# Patient Record
Sex: Male | Born: 1950 | Race: White | Hispanic: No | Marital: Single | Smoking: Never smoker
Health system: Southern US, Community
[De-identification: ages and names within clinical notes are randomized; demographics above are authoritative.]

## PROBLEM LIST (undated history)

## (undated) DIAGNOSIS — C8510 Unspecified B-cell lymphoma, unspecified site: Secondary | ICD-10-CM

## (undated) DIAGNOSIS — Z9221 Personal history of antineoplastic chemotherapy: Secondary | ICD-10-CM

## (undated) DIAGNOSIS — R7303 Prediabetes: Secondary | ICD-10-CM

## (undated) DIAGNOSIS — D696 Thrombocytopenia, unspecified: Secondary | ICD-10-CM

## (undated) DIAGNOSIS — D72819 Decreased white blood cell count, unspecified: Secondary | ICD-10-CM

## (undated) DIAGNOSIS — Z9289 Personal history of other medical treatment: Secondary | ICD-10-CM

## (undated) DIAGNOSIS — E782 Mixed hyperlipidemia: Secondary | ICD-10-CM

## (undated) DIAGNOSIS — B029 Zoster without complications: Secondary | ICD-10-CM

## (undated) DIAGNOSIS — C4490 Unspecified malignant neoplasm of skin, unspecified: Secondary | ICD-10-CM

## (undated) DIAGNOSIS — Z87442 Personal history of urinary calculi: Secondary | ICD-10-CM

## (undated) HISTORY — PX: DUPUYTREN / PALMAR FASCIOTOMY: SUR601

## (undated) HISTORY — DX: Zoster without complications: B02.9

## (undated) HISTORY — DX: Unspecified malignant neoplasm of skin, unspecified: C44.90

## (undated) HISTORY — DX: Unspecified B-cell lymphoma, unspecified site: C85.10

## (undated) HISTORY — DX: Decreased white blood cell count, unspecified: D72.819

## (undated) HISTORY — DX: Thrombocytopenia, unspecified: D69.6

## (undated) HISTORY — DX: Mixed hyperlipidemia: E78.2

## (undated) HISTORY — PX: OTHER SURGICAL HISTORY: SHX169

---

## 1997-08-14 ENCOUNTER — Ambulatory Visit (HOSPITAL_COMMUNITY): Admission: RE | Admit: 1997-08-14 | Discharge: 1997-08-14 | Payer: Self-pay | Admitting: *Deleted

## 1998-12-24 ENCOUNTER — Encounter: Admission: RE | Admit: 1998-12-24 | Discharge: 1998-12-24 | Payer: Self-pay | Admitting: *Deleted

## 1998-12-24 ENCOUNTER — Encounter: Payer: Self-pay | Admitting: *Deleted

## 1999-01-14 ENCOUNTER — Encounter (INDEPENDENT_AMBULATORY_CARE_PROVIDER_SITE_OTHER): Payer: Self-pay | Admitting: *Deleted

## 1999-01-14 ENCOUNTER — Ambulatory Visit (HOSPITAL_BASED_OUTPATIENT_CLINIC_OR_DEPARTMENT_OTHER): Admission: RE | Admit: 1999-01-14 | Discharge: 1999-01-14 | Payer: Self-pay

## 1999-01-25 ENCOUNTER — Encounter: Payer: Self-pay | Admitting: *Deleted

## 1999-01-25 ENCOUNTER — Encounter: Admission: RE | Admit: 1999-01-25 | Discharge: 1999-01-25 | Payer: Self-pay | Admitting: *Deleted

## 1999-01-28 ENCOUNTER — Ambulatory Visit (HOSPITAL_COMMUNITY): Admission: RE | Admit: 1999-01-28 | Discharge: 1999-01-28 | Payer: Self-pay | Admitting: Oncology

## 1999-02-01 ENCOUNTER — Ambulatory Visit (HOSPITAL_COMMUNITY): Admission: RE | Admit: 1999-02-01 | Discharge: 1999-02-01 | Payer: Self-pay | Admitting: Oncology

## 1999-02-01 ENCOUNTER — Encounter: Payer: Self-pay | Admitting: Oncology

## 1999-04-13 ENCOUNTER — Encounter: Admission: RE | Admit: 1999-04-13 | Discharge: 1999-04-13 | Payer: Self-pay | Admitting: Oncology

## 1999-04-13 ENCOUNTER — Encounter: Payer: Self-pay | Admitting: Oncology

## 1999-04-23 ENCOUNTER — Ambulatory Visit (HOSPITAL_COMMUNITY): Admission: RE | Admit: 1999-04-23 | Discharge: 1999-04-23 | Payer: Self-pay | Admitting: Oncology

## 1999-06-13 ENCOUNTER — Ambulatory Visit (HOSPITAL_COMMUNITY): Admission: RE | Admit: 1999-06-13 | Discharge: 1999-06-13 | Payer: Self-pay | Admitting: Oncology

## 1999-06-13 ENCOUNTER — Encounter: Payer: Self-pay | Admitting: Oncology

## 1999-06-14 ENCOUNTER — Encounter: Admission: RE | Admit: 1999-06-14 | Discharge: 1999-06-14 | Payer: Self-pay | Admitting: Oncology

## 1999-06-14 ENCOUNTER — Encounter: Payer: Self-pay | Admitting: Oncology

## 1999-10-05 ENCOUNTER — Encounter: Payer: Self-pay | Admitting: Oncology

## 1999-10-05 ENCOUNTER — Encounter: Admission: RE | Admit: 1999-10-05 | Discharge: 1999-10-05 | Payer: Self-pay | Admitting: Oncology

## 2000-02-02 ENCOUNTER — Encounter: Admission: RE | Admit: 2000-02-02 | Discharge: 2000-02-02 | Payer: Self-pay | Admitting: Oncology

## 2000-02-02 ENCOUNTER — Encounter: Payer: Self-pay | Admitting: Oncology

## 2000-08-09 ENCOUNTER — Encounter: Payer: Self-pay | Admitting: Oncology

## 2000-08-09 ENCOUNTER — Encounter: Admission: RE | Admit: 2000-08-09 | Discharge: 2000-08-09 | Payer: Self-pay | Admitting: Oncology

## 2000-12-05 ENCOUNTER — Encounter: Admission: RE | Admit: 2000-12-05 | Discharge: 2000-12-05 | Payer: Self-pay | Admitting: Oncology

## 2000-12-05 ENCOUNTER — Encounter: Payer: Self-pay | Admitting: Oncology

## 2001-06-20 ENCOUNTER — Encounter: Admission: RE | Admit: 2001-06-20 | Discharge: 2001-06-20 | Payer: Self-pay | Admitting: Oncology

## 2001-06-20 ENCOUNTER — Encounter: Payer: Self-pay | Admitting: Oncology

## 2001-12-17 ENCOUNTER — Encounter: Payer: Self-pay | Admitting: Oncology

## 2001-12-17 ENCOUNTER — Encounter: Admission: RE | Admit: 2001-12-17 | Discharge: 2001-12-17 | Payer: Self-pay | Admitting: Oncology

## 2002-06-17 ENCOUNTER — Encounter: Payer: Self-pay | Admitting: Oncology

## 2002-06-17 ENCOUNTER — Encounter: Admission: RE | Admit: 2002-06-17 | Discharge: 2002-06-17 | Payer: Self-pay | Admitting: Oncology

## 2003-01-15 ENCOUNTER — Encounter: Admission: RE | Admit: 2003-01-15 | Discharge: 2003-01-15 | Payer: Self-pay | Admitting: Oncology

## 2003-08-24 ENCOUNTER — Encounter: Admission: RE | Admit: 2003-08-24 | Discharge: 2003-08-24 | Payer: Self-pay | Admitting: Oncology

## 2004-02-22 ENCOUNTER — Ambulatory Visit: Payer: Self-pay | Admitting: Oncology

## 2004-02-24 ENCOUNTER — Ambulatory Visit (HOSPITAL_COMMUNITY): Admission: RE | Admit: 2004-02-24 | Discharge: 2004-02-24 | Payer: Self-pay | Admitting: Oncology

## 2005-02-21 ENCOUNTER — Ambulatory Visit: Payer: Self-pay | Admitting: Oncology

## 2005-02-23 ENCOUNTER — Ambulatory Visit (HOSPITAL_COMMUNITY): Admission: RE | Admit: 2005-02-23 | Discharge: 2005-02-23 | Payer: Self-pay | Admitting: Oncology

## 2006-02-14 ENCOUNTER — Ambulatory Visit: Payer: Self-pay | Admitting: Oncology

## 2006-02-20 ENCOUNTER — Ambulatory Visit (HOSPITAL_COMMUNITY): Admission: RE | Admit: 2006-02-20 | Discharge: 2006-02-20 | Payer: Self-pay | Admitting: Oncology

## 2006-02-20 LAB — CBC WITH DIFFERENTIAL/PLATELET
EOS%: 3.5 % (ref 0.0–7.0)
Eosinophils Absolute: 0.2 10*3/uL (ref 0.0–0.5)
HGB: 15.1 g/dL (ref 13.0–17.1)
MCV: 94.4 fL (ref 81.6–98.0)
MONO%: 8.9 % (ref 0.0–13.0)
NEUT#: 3.3 10*3/uL (ref 1.5–6.5)
RBC: 4.45 10*6/uL (ref 4.20–5.71)
RDW: 13.1 % (ref 11.2–14.6)
lymph#: 1.1 10*3/uL (ref 0.9–3.3)

## 2006-02-20 LAB — MORPHOLOGY: PLT EST: ADEQUATE

## 2006-02-20 LAB — COMPREHENSIVE METABOLIC PANEL
AST: 19 U/L (ref 0–37)
Alkaline Phosphatase: 86 U/L (ref 39–117)
BUN: 12 mg/dL (ref 6–23)
Glucose, Bld: 77 mg/dL (ref 70–99)
Sodium: 140 mEq/L (ref 135–145)
Total Bilirubin: 0.7 mg/dL (ref 0.3–1.2)

## 2007-02-21 ENCOUNTER — Ambulatory Visit: Payer: Self-pay | Admitting: Oncology

## 2007-02-25 LAB — COMPREHENSIVE METABOLIC PANEL
ALT: 32 U/L (ref 0–53)
CO2: 29 mEq/L (ref 19–32)
Calcium: 9.3 mg/dL (ref 8.4–10.5)
Chloride: 100 mEq/L (ref 96–112)
Creatinine, Ser: 0.91 mg/dL (ref 0.40–1.50)
Glucose, Bld: 153 mg/dL — ABNORMAL HIGH (ref 70–99)
Total Bilirubin: 0.9 mg/dL (ref 0.3–1.2)
Total Protein: 6.8 g/dL (ref 6.0–8.3)

## 2007-02-25 LAB — CBC WITH DIFFERENTIAL/PLATELET
BASO%: 0.2 % (ref 0.0–2.0)
Basophils Absolute: 0 10*3/uL (ref 0.0–0.1)
EOS%: 1.9 % (ref 0.0–7.0)
HCT: 41.1 % (ref 38.7–49.9)
HGB: 14.3 g/dL (ref 13.0–17.1)
LYMPH%: 15.9 % (ref 14.0–48.0)
MCH: 32.9 pg (ref 28.0–33.4)
MCHC: 34.8 g/dL (ref 32.0–35.9)
MCV: 94.4 fL (ref 81.6–98.0)
NEUT%: 72.7 % (ref 40.0–75.0)
Platelets: 243 10*3/uL (ref 145–400)

## 2007-02-25 LAB — SEDIMENTATION RATE: Sed Rate: 18 mm/hr — ABNORMAL HIGH (ref 0–16)

## 2007-02-25 LAB — LACTATE DEHYDROGENASE: LDH: 138 U/L (ref 94–250)

## 2007-02-26 ENCOUNTER — Ambulatory Visit (HOSPITAL_COMMUNITY): Admission: RE | Admit: 2007-02-26 | Discharge: 2007-02-26 | Payer: Self-pay | Admitting: Oncology

## 2008-02-21 ENCOUNTER — Ambulatory Visit: Payer: Self-pay | Admitting: Oncology

## 2008-02-25 LAB — COMPREHENSIVE METABOLIC PANEL
ALT: 34 U/L (ref 0–53)
Albumin: 3.9 g/dL (ref 3.5–5.2)
CO2: 29 mEq/L (ref 19–32)
Calcium: 9.1 mg/dL (ref 8.4–10.5)
Chloride: 102 mEq/L (ref 96–112)
Potassium: 3.8 mEq/L (ref 3.5–5.3)
Sodium: 137 mEq/L (ref 135–145)
Total Bilirubin: 1.2 mg/dL (ref 0.3–1.2)
Total Protein: 6.2 g/dL (ref 6.0–8.3)

## 2008-02-25 LAB — CBC WITH DIFFERENTIAL/PLATELET
Eosinophils Absolute: 0.2 10*3/uL (ref 0.0–0.5)
HCT: 43.4 % (ref 38.7–49.9)
LYMPH%: 20.7 % (ref 14.0–48.0)
MCV: 96.2 fL (ref 81.6–98.0)
MONO#: 0.4 10*3/uL (ref 0.1–0.9)
MONO%: 7.9 % (ref 0.0–13.0)
NEUT#: 3.2 10*3/uL (ref 1.5–6.5)
NEUT%: 67.7 % (ref 40.0–75.0)
Platelets: 174 10*3/uL (ref 145–400)
WBC: 4.8 10*3/uL (ref 4.0–10.0)

## 2008-02-25 LAB — MORPHOLOGY: PLT EST: ADEQUATE

## 2008-02-26 LAB — LACTATE DEHYDROGENASE: LDH: 166 U/L (ref 94–250)

## 2009-02-22 ENCOUNTER — Ambulatory Visit: Payer: Self-pay | Admitting: Oncology

## 2009-02-24 LAB — CBC WITH DIFFERENTIAL/PLATELET
BASO%: 0.4 % (ref 0.0–2.0)
Basophils Absolute: 0 10*3/uL (ref 0.0–0.1)
EOS%: 4.1 % (ref 0.0–7.0)
Eosinophils Absolute: 0.2 10*3/uL (ref 0.0–0.5)
HCT: 42.5 % (ref 38.4–49.9)
HGB: 14.7 g/dL (ref 13.0–17.1)
LYMPH%: 20.8 % (ref 14.0–49.0)
MCH: 34 pg — ABNORMAL HIGH (ref 27.2–33.4)
MCHC: 34.6 g/dL (ref 32.0–36.0)
MCV: 98.1 fL — ABNORMAL HIGH (ref 79.3–98.0)
MONO#: 0.4 10*3/uL (ref 0.1–0.9)
MONO%: 9.2 % (ref 0.0–14.0)
NEUT#: 2.6 10*3/uL (ref 1.5–6.5)
NEUT%: 65.5 % (ref 39.0–75.0)
Platelets: 170 10*3/uL (ref 140–400)
RBC: 4.34 10*6/uL (ref 4.20–5.82)
RDW: 13.3 % (ref 11.0–14.6)
WBC: 4 10*3/uL (ref 4.0–10.3)
lymph#: 0.8 10*3/uL — ABNORMAL LOW (ref 0.9–3.3)

## 2009-02-24 LAB — COMPREHENSIVE METABOLIC PANEL
ALT: 21 U/L (ref 0–53)
AST: 21 U/L (ref 0–37)
Albumin: 4.5 g/dL (ref 3.5–5.2)
Alkaline Phosphatase: 78 U/L (ref 39–117)
BUN: 16 mg/dL (ref 6–23)
CO2: 25 mEq/L (ref 19–32)
Calcium: 9.1 mg/dL (ref 8.4–10.5)
Chloride: 103 mEq/L (ref 96–112)
Creatinine, Ser: 1.03 mg/dL (ref 0.40–1.50)
Glucose, Bld: 90 mg/dL (ref 70–99)
Potassium: 4.6 mEq/L (ref 3.5–5.3)
Sodium: 138 mEq/L (ref 135–145)
Total Bilirubin: 1 mg/dL (ref 0.3–1.2)
Total Protein: 6.5 g/dL (ref 6.0–8.3)

## 2009-02-24 LAB — MORPHOLOGY
PLT EST: ADEQUATE
RBC Comments: NORMAL

## 2009-02-24 LAB — LACTATE DEHYDROGENASE: LDH: 139 U/L (ref 94–250)

## 2009-03-02 ENCOUNTER — Ambulatory Visit (HOSPITAL_COMMUNITY): Admission: RE | Admit: 2009-03-02 | Discharge: 2009-03-02 | Payer: Self-pay | Admitting: Oncology

## 2009-04-01 ENCOUNTER — Ambulatory Visit: Payer: Self-pay | Admitting: Oncology

## 2010-01-29 ENCOUNTER — Encounter: Payer: Self-pay | Admitting: Oncology

## 2010-01-30 ENCOUNTER — Encounter: Payer: Self-pay | Admitting: Oncology

## 2010-03-28 ENCOUNTER — Other Ambulatory Visit: Payer: Self-pay | Admitting: Oncology

## 2010-03-28 ENCOUNTER — Encounter (HOSPITAL_BASED_OUTPATIENT_CLINIC_OR_DEPARTMENT_OTHER): Payer: BC Managed Care – PPO | Admitting: Oncology

## 2010-03-28 DIAGNOSIS — C8295 Follicular lymphoma, unspecified, lymph nodes of inguinal region and lower limb: Secondary | ICD-10-CM

## 2010-03-28 LAB — CBC WITH DIFFERENTIAL/PLATELET
Basophils Absolute: 0 10*3/uL (ref 0.0–0.1)
Eosinophils Absolute: 0.1 10*3/uL (ref 0.0–0.5)
HGB: 14.1 g/dL (ref 13.0–17.1)
MCV: 96.3 fL (ref 79.3–98.0)
MONO#: 0.4 10*3/uL (ref 0.1–0.9)
MONO%: 10.3 % (ref 0.0–14.0)
NEUT#: 2.6 10*3/uL (ref 1.5–6.5)
Platelets: 172 10*3/uL (ref 140–400)
RBC: 4.18 10*6/uL — ABNORMAL LOW (ref 4.20–5.82)
RDW: 14.3 % (ref 11.0–14.6)
WBC: 4.2 10*3/uL (ref 4.0–10.3)

## 2010-03-28 LAB — MORPHOLOGY: RBC Comments: NORMAL

## 2010-03-28 LAB — COMPREHENSIVE METABOLIC PANEL
ALT: 28 U/L (ref 0–53)
AST: 28 U/L (ref 0–37)
Albumin: 4.1 g/dL (ref 3.5–5.2)
Alkaline Phosphatase: 79 U/L (ref 39–117)
Calcium: 9.2 mg/dL (ref 8.4–10.5)
Chloride: 107 mEq/L (ref 96–112)
Potassium: 4.2 mEq/L (ref 3.5–5.3)
Sodium: 140 mEq/L (ref 135–145)
Total Protein: 6.5 g/dL (ref 6.0–8.3)

## 2010-04-11 ENCOUNTER — Ambulatory Visit (HOSPITAL_COMMUNITY)
Admission: RE | Admit: 2010-04-11 | Discharge: 2010-04-11 | Disposition: A | Discharge status: Home / Self Care | Payer: BC Managed Care – PPO | Source: Ambulatory Visit | Attending: Oncology | Admitting: Oncology

## 2010-04-11 ENCOUNTER — Encounter: Payer: BC Managed Care – PPO | Admitting: Oncology

## 2010-04-11 ENCOUNTER — Other Ambulatory Visit: Payer: Self-pay | Admitting: Oncology

## 2010-04-11 DIAGNOSIS — C8589 Other specified types of non-Hodgkin lymphoma, extranodal and solid organ sites: Secondary | ICD-10-CM | POA: Insufficient documentation

## 2010-04-11 DIAGNOSIS — C8590 Non-Hodgkin lymphoma, unspecified, unspecified site: Secondary | ICD-10-CM

## 2011-04-04 ENCOUNTER — Telehealth: Payer: Self-pay | Admitting: Oncology

## 2011-04-04 NOTE — Telephone Encounter (Signed)
lmonvm for pt re appts for 4/5 and 4/12.

## 2011-04-14 ENCOUNTER — Other Ambulatory Visit: Payer: BC Managed Care – PPO | Admitting: Lab

## 2011-04-18 ENCOUNTER — Telehealth: Payer: Self-pay | Admitting: Oncology

## 2011-04-18 NOTE — Telephone Encounter (Signed)
Return call from [facility] @ Dr. Gabriel Rung office. Per Wille Celeste pt will be out of town and cannot come 4/12. appt r/s to 6/10 @ 11:30 am. Per Wille Celeste June ok and was given appt for 6*10 @ 11:30 am.

## 2011-04-21 ENCOUNTER — Ambulatory Visit: Payer: BC Managed Care – PPO | Admitting: Oncology

## 2011-06-19 ENCOUNTER — Ambulatory Visit (HOSPITAL_BASED_OUTPATIENT_CLINIC_OR_DEPARTMENT_OTHER): Payer: BC Managed Care – PPO | Admitting: Oncology

## 2011-06-19 ENCOUNTER — Ambulatory Visit: Payer: BC Managed Care – PPO

## 2011-06-19 ENCOUNTER — Ambulatory Visit (HOSPITAL_BASED_OUTPATIENT_CLINIC_OR_DEPARTMENT_OTHER): Payer: BC Managed Care – PPO | Admitting: Lab

## 2011-06-19 ENCOUNTER — Telehealth: Payer: Self-pay | Admitting: Oncology

## 2011-06-19 ENCOUNTER — Encounter: Payer: Self-pay | Admitting: Oncology

## 2011-06-19 VITALS — BP 144/87 | HR 64 | Temp 97.0°F | Ht 73.0 in | Wt 235.8 lb

## 2011-06-19 DIAGNOSIS — E782 Mixed hyperlipidemia: Secondary | ICD-10-CM | POA: Insufficient documentation

## 2011-06-19 DIAGNOSIS — C8589 Other specified types of non-Hodgkin lymphoma, extranodal and solid organ sites: Secondary | ICD-10-CM

## 2011-06-19 DIAGNOSIS — C8590 Non-Hodgkin lymphoma, unspecified, unspecified site: Secondary | ICD-10-CM

## 2011-06-19 DIAGNOSIS — B029 Zoster without complications: Secondary | ICD-10-CM | POA: Insufficient documentation

## 2011-06-19 DIAGNOSIS — C8510 Unspecified B-cell lymphoma, unspecified site: Secondary | ICD-10-CM

## 2011-06-19 DIAGNOSIS — Z8579 Personal history of other malignant neoplasms of lymphoid, hematopoietic and related tissues: Secondary | ICD-10-CM | POA: Insufficient documentation

## 2011-06-19 LAB — CBC WITH DIFFERENTIAL/PLATELET
Basophils Absolute: 0 10*3/uL (ref 0.0–0.1)
Eosinophils Absolute: 0.1 10*3/uL (ref 0.0–0.5)
HCT: 46.4 % (ref 38.4–49.9)
HGB: 15.8 g/dL (ref 13.0–17.1)
MCV: 97.3 fL (ref 79.3–98.0)
MONO%: 7.7 % (ref 0.0–14.0)
NEUT#: 3.9 10*3/uL (ref 1.5–6.5)
NEUT%: 71.2 % (ref 39.0–75.0)
RDW: 13.6 % (ref 11.0–14.6)

## 2011-06-19 NOTE — Telephone Encounter (Signed)
gv pt appt for [id number]

## 2011-06-19 NOTE — Progress Notes (Signed)
Hematology and Oncology Follow Up Visit  Frank Walsh [id number] 03-Aug-1950 61 y.o. 06/19/2011 5:57 PM   Principle Diagnosis: Encounter Diagnosis  Name Primary?  . 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 A biopsy showed a follicular grade 2 B-cell non-Hodgkin's lymphoma. He was treated with CHOP chemotherapy for 6 cycles and achieved a complete response. He received 4 doses of Rituxan anti-B-cell antibody consolidation after the chemotherapy program. He achieved a durable response with no signs of relapse.  He is doing well at this time. He has no constitutional symptoms. 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  Medications: reviewed  Allergies: No Known Allergies  Review of Systems: Constitutional:   See above Respiratory: No cough or dyspnea Cardiovascular: No chest pain or palpitations  Gastrointestinal: No change in bowel habit Genito-Urinary: Not questioned Musculoskeletal: No muscle or bone pain Neurologic: No headache or change in vision Skin: No rash Remaining ROS negative.  Physical Exam: Blood pressure 144/87, pulse 64, temperature 97 F (36.1 C), temperature source Oral, height 6\' 1"  (1.854 m), weight 235 lb 12.8 oz (106.958 kg). Wt Readings from Last 3 Encounters:  06/19/11 235 lb 12.8 oz (106.958 kg)     General appearance: Well-nourished Caucasian man HENNT: Pharynx no erythema or exudate Lymph nodes: No cervical, supraclavicular, axillary, or inguinal adenopathy Breasts: Lungs: Clear to auscultation resonant to percussion Heart: Regular rhythm no murmur Abdomen: Soft nontender no mass no organomegaly Extremities: No edema no calf tenderness Vascular: No cyanosis Neurologic: Motor strength 5 over  5, reflexes 2+ symmetric Skin: No rash or ecchymosis  Lab Results: Lab Results  Component Value Date   WBC 5.4 06/19/2011   HGB 15.8 06/19/2011   HCT 46.4 06/19/2011   MCV 97.3 06/19/2011   PLT 174 06/19/2011     Chemistry      Component Value Date/Time   NA 140 03/28/2010 1620   K 4.2 03/28/2010 1620   CL 107 03/28/2010 1620   CO2 26 03/28/2010 1620   BUN 12 03/28/2010 1620   CREATININE 0.92 03/28/2010 1620      Component Value Date/Time   CALCIUM 9.2 03/28/2010 1620   ALKPHOS 79 03/28/2010 1620   AST 28 03/28/2010 1620   ALT 28 03/28/2010 1620   BILITOT 1.0 03/28/2010 1620      Impression and Plan: Stage IA, follicular grade 2, B-cell non-Hodgkin's lymphoma treated as outlined above. He remains in a clinical remission at this time now out over 12 years from diagnosis in January 2001. Plan: Continue annual followup  CC:. Dr. Marden Noble   Levert Feinstein, MD 6/10/20135:57 PM

## 2011-06-20 ENCOUNTER — Telehealth: Payer: Self-pay | Admitting: *Deleted

## 2011-06-20 LAB — COMPREHENSIVE METABOLIC PANEL
ALT: 26 U/L (ref 0–53)
CO2: 28 mEq/L (ref 19–32)
Calcium: 9.6 mg/dL (ref 8.4–10.5)
Chloride: 103 mEq/L (ref 96–112)
Creatinine, Ser: 0.9 mg/dL (ref 0.50–1.35)
Glucose, Bld: 107 mg/dL — ABNORMAL HIGH (ref 70–99)
Sodium: 139 mEq/L (ref 135–145)
Total Protein: 6.6 g/dL (ref 6.0–8.3)

## 2011-06-20 LAB — SEDIMENTATION RATE: Sed Rate: 4 mm/hr (ref 0–16)

## 2011-06-20 LAB — LACTATE DEHYDROGENASE: LDH: 129 U/L (ref 94–250)

## 2011-06-20 NOTE — Telephone Encounter (Signed)
Message copied by Sabino Snipes on Tue Jun 20, 2011  2:50 PM ------      Message from: Levert Feinstein      Created: Mon Jun 19, 2011  7:06 PM       Call Dr Marcha Solders - lab good - route cc to Dr Elvera Lennox Johnella Moloney

## 2011-06-20 NOTE — Telephone Encounter (Signed)
Results of labs given to pt per Dr. Cyndie Chime.  The pt would like a copy of these labs & asked that they be faxed to [phone].  CC to Dr. Johnella Moloney electronically.

## 2012-03-07 ENCOUNTER — Telehealth: Payer: Self-pay | Admitting: Oncology

## 2012-03-07 NOTE — Telephone Encounter (Signed)
lm gv pt appts d/t. ask pt to call back to confirm..made pt awae of letter/cal to be mailed...td °

## 2012-03-15 ENCOUNTER — Encounter (INDEPENDENT_AMBULATORY_CARE_PROVIDER_SITE_OTHER): Payer: BC Managed Care – PPO | Admitting: Ophthalmology

## 2012-03-15 DIAGNOSIS — H43819 Vitreous degeneration, unspecified eye: Secondary | ICD-10-CM

## 2012-03-20 ENCOUNTER — Ambulatory Visit (INDEPENDENT_AMBULATORY_CARE_PROVIDER_SITE_OTHER): Payer: BC Managed Care – PPO | Admitting: Ophthalmology

## 2012-03-28 ENCOUNTER — Ambulatory Visit (INDEPENDENT_AMBULATORY_CARE_PROVIDER_SITE_OTHER): Payer: BC Managed Care – PPO | Admitting: Ophthalmology

## 2012-06-11 ENCOUNTER — Other Ambulatory Visit: Payer: BC Managed Care – PPO | Admitting: Lab

## 2012-06-11 ENCOUNTER — Ambulatory Visit: Payer: BC Managed Care – PPO | Admitting: Oncology

## 2012-07-01 ENCOUNTER — Other Ambulatory Visit (HOSPITAL_BASED_OUTPATIENT_CLINIC_OR_DEPARTMENT_OTHER): Payer: BC Managed Care – PPO | Admitting: Lab

## 2012-07-01 ENCOUNTER — Telehealth: Payer: Self-pay | Admitting: Oncology

## 2012-07-01 ENCOUNTER — Ambulatory Visit (HOSPITAL_BASED_OUTPATIENT_CLINIC_OR_DEPARTMENT_OTHER): Payer: BC Managed Care – PPO | Admitting: Oncology

## 2012-07-01 ENCOUNTER — Encounter: Payer: Self-pay | Admitting: Oncology

## 2012-07-01 VITALS — BP 137/80 | HR 56 | Temp 97.9°F | Resp 18 | Ht 73.0 in | Wt 238.2 lb

## 2012-07-01 DIAGNOSIS — D72819 Decreased white blood cell count, unspecified: Secondary | ICD-10-CM | POA: Insufficient documentation

## 2012-07-01 DIAGNOSIS — C8589 Other specified types of non-Hodgkin lymphoma, extranodal and solid organ sites: Secondary | ICD-10-CM

## 2012-07-01 DIAGNOSIS — C8510 Unspecified B-cell lymphoma, unspecified site: Secondary | ICD-10-CM

## 2012-07-01 DIAGNOSIS — C859A Non-Hodgkin lymphoma, unspecified, in remission: Secondary | ICD-10-CM

## 2012-07-01 DIAGNOSIS — C8590 Non-Hodgkin lymphoma, unspecified, unspecified site: Secondary | ICD-10-CM

## 2012-07-01 DIAGNOSIS — D696 Thrombocytopenia, unspecified: Secondary | ICD-10-CM

## 2012-07-01 LAB — COMPREHENSIVE METABOLIC PANEL (CC13)
ALT: 30 U/L (ref 0–55)
BUN: 18.5 mg/dL (ref 7.0–26.0)
CO2: 24 mEq/L (ref 22–29)
Calcium: 9.1 mg/dL (ref 8.4–10.4)
Chloride: 107 mEq/L (ref 98–107)
Creatinine: 0.9 mg/dL (ref 0.7–1.3)

## 2012-07-01 LAB — CBC WITH DIFFERENTIAL/PLATELET
BASO%: 0.3 % (ref 0.0–2.0)
EOS%: 5.2 % (ref 0.0–7.0)
MCH: 32 pg (ref 27.2–33.4)
MCHC: 32.7 g/dL (ref 32.0–36.0)
RDW: 13.8 % (ref 11.0–14.6)
lymph#: 0.9 10*3/uL (ref 0.9–3.3)

## 2012-07-01 LAB — LACTATE DEHYDROGENASE (CC13): LDH: 167 U/L (ref 125–245)

## 2012-07-01 LAB — SEDIMENTATION RATE: Sed Rate: 1 mm/hr (ref 0–16)

## 2012-07-01 NOTE — Telephone Encounter (Signed)
Gave appt for lab and MD visit on 2015 visit to secretary

## 2012-07-01 NOTE — Telephone Encounter (Signed)
lvm for pt regarding to June 2015 appt....mailed pt avs and letter

## 2012-07-01 NOTE — Progress Notes (Signed)
Hematology and Oncology Follow Up Visit  Frank Walsh [id number] [birth year] 62 y.o. 07/01/2012 1:51 PM   Principle Diagnosis: Encounter Diagnoses  Name Primary?  . Leukopenia   . Thrombocytopenia, unspecified   . 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 A biopsy showed a follicular grade 2 B-cell non-Hodgkin's lymphoma. He was treated with CHOP chemotherapy for 6 cycles and achieved a complete response. He received 4 doses of Rituxan anti-B-cell antibody consolidation after the chemotherapy program. He achieved a durable response with no signs of relapse to date.  He is doing he did develop sudden onset of change in vision in his left eye and was found to have a retinal detachment. He was referred to Dr. [name] and had laser surgery. He had some "weak spots" in the right eye and was given laser treatments to this eye as well. On the other changes increasing hypertrophy of the tendons in his hands with a trigger finger on the left small finger and additional tendon hypertrophy on his right hand as well.  He reports no constitutional symptoms. He has had no intercurrent infections. He has not started any new medications. 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   Medications: reviewed  Allergies: No Known Allergies  Review of Systems: Constitutional:   See above Respiratory: No cough or  dyspnea Cardiovascular:  No chest pain or palpitations Gastrointestinal: No change in bowel habit Genito-Urinary: Not questioned Musculoskeletal: See above Neurologic: See above Skin: No rash Remaining ROS negative.  Physical Exam: Blood pressure 137/80, pulse 56, temperature 97.9 F (36.6 C), temperature source Oral, resp. rate 18, height 6\' 1"  (1.854 m), weight 238 lb 3.2 oz (108.047 kg), SpO2 96.00%. Wt Readings from Last 3 Encounters:  07/01/12 238 lb 3.2 oz (108.047 kg)  06/19/11 235 lb 12.8 oz (106.958 kg)     General appearance: Well-nourished Caucasian man Frank Walsh: Pharynx no erythema or exudate. Chronic strabismus. Lymph nodes: Dense scar right inguinal area a site of previous excision of lymph node. No definite adenopathy at that site or any place else. Breasts: Lungs: Clear to auscultation resonant to percussion Heart: Regular rhythm no murmur Abdomen: Soft, nontender, no mass, no organomegaly Extremities: No edema, no calf tenderness Musculoskeletal: Tendon hypertrophy as noted above GU: Vascular: Neurologic: Grossly normal Skin: No rash or ecchymosis  Lab Results: Lab Results: White count differential: 60% neutrophils, 24% lymphocytes, 11% monocytes, 5% eosinophils   Component Value Date   WBC 3.9* 07/01/2012   HGB 14.7 07/01/2012   HCT 45.0 07/01/2012   MCV 97.8 07/01/2012   PLT 140 07/01/2012     Chemistry      Component Value Date/Time   NA 140 07/01/2012 1153   NA 139 06/19/2011 1240   K 4.3 07/01/2012 1153  K 4.2 06/19/2011 1240   CL 107 07/01/2012 1153   CL 103 06/19/2011 1240   CO2 24 07/01/2012 1153   CO2 28 06/19/2011 1240   BUN 18.5 07/01/2012 1153   BUN 16 06/19/2011 1240   CREATININE 0.9 07/01/2012 1153   CREATININE 0.90 06/19/2011 1240      Component Value Date/Time   CALCIUM 9.1 07/01/2012 1153   CALCIUM 9.6 06/19/2011 1240   ALKPHOS 102 07/01/2012 1153   ALKPHOS 86 06/19/2011 1240   AST 23 07/01/2012 1153   AST 20 06/19/2011 1240   ALT 30 07/01/2012  1153   ALT 26 06/19/2011 1240   BILITOT 0.95 07/01/2012 1153   BILITOT 0.9 06/19/2011 1240       Impression: #1. Intermediate grade non-Hodgkin's lymphoma. 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 He has done extremely well now out 13-1/2 years. Lab today now shows mild leukopenia and thrombocytopenia without anemia. He has had periodic low white counts with white count recorded as 4200 in March 2012. Platelets have never been decreased. As noted above, he has had no recent infections and has not started any new medications.  I brought these changes to his attention. I would like to get a repeat blood count in one month. We may need further evaluation if there are persistent abnormalities with respect to restaging his lymphoma and consideration of a bone marrow biopsy.    CC:. Dr. [name], MD 6/23/20141:51 PM

## 2012-07-29 ENCOUNTER — Other Ambulatory Visit (HOSPITAL_BASED_OUTPATIENT_CLINIC_OR_DEPARTMENT_OTHER): Payer: BC Managed Care – PPO

## 2012-07-29 DIAGNOSIS — D72819 Decreased white blood cell count, unspecified: Secondary | ICD-10-CM

## 2012-07-29 DIAGNOSIS — C8589 Other specified types of non-Hodgkin lymphoma, extranodal and solid organ sites: Secondary | ICD-10-CM

## 2012-07-29 DIAGNOSIS — D696 Thrombocytopenia, unspecified: Secondary | ICD-10-CM

## 2012-07-29 DIAGNOSIS — C8510 Unspecified B-cell lymphoma, unspecified site: Secondary | ICD-10-CM

## 2012-07-29 LAB — CBC & DIFF AND RETIC
BASO%: 0.4 % (ref 0.0–2.0)
HCT: 43.3 % (ref 38.4–49.9)
MCHC: 33.5 g/dL (ref 32.0–36.0)
MONO#: 0.4 10*3/uL (ref 0.1–0.9)
NEUT%: 69.2 % (ref 39.0–75.0)
WBC: 4.9 10*3/uL (ref 4.0–10.3)
lymph#: 0.9 10*3/uL (ref 0.9–3.3)

## 2012-07-29 LAB — MORPHOLOGY: PLT EST: NORMAL

## 2012-07-29 LAB — CHCC SMEAR

## 2012-07-31 ENCOUNTER — Ambulatory Visit (INDEPENDENT_AMBULATORY_CARE_PROVIDER_SITE_OTHER): Payer: BC Managed Care – PPO | Admitting: Ophthalmology

## 2013-03-08 ENCOUNTER — Encounter: Payer: Self-pay | Admitting: Oncology

## 2013-06-30 ENCOUNTER — Other Ambulatory Visit: Payer: BC Managed Care – PPO

## 2013-06-30 ENCOUNTER — Ambulatory Visit: Payer: BC Managed Care – PPO | Admitting: Oncology

## 2013-06-30 ENCOUNTER — Telehealth: Payer: Self-pay | Admitting: Oncology

## 2013-06-30 NOTE — Telephone Encounter (Signed)
Pt came in @ 12 noon, per pt he was not aware of appt, asked Amy Dr Benay Spice nurse when to r/s , Dr Benay Spice suggested that pt see Dr. Alvy Bimler , pt secretary will call me to set up schedule, pt is a dentist

## 2013-07-09 ENCOUNTER — Telehealth: Payer: Self-pay | Admitting: Hematology and Oncology

## 2013-07-09 NOTE — Telephone Encounter (Signed)
Pt called and r/s appt, a former Dr Beryle Beams pt

## 2013-07-09 NOTE — Telephone Encounter (Signed)
Gave pt appt for Md only a former Dr. Beryle Beams pt, a r/s appt

## 2013-07-29 ENCOUNTER — Telehealth: Payer: Self-pay | Admitting: Hematology and Oncology

## 2013-07-29 ENCOUNTER — Ambulatory Visit (HOSPITAL_BASED_OUTPATIENT_CLINIC_OR_DEPARTMENT_OTHER): Payer: BC Managed Care – PPO | Admitting: Hematology and Oncology

## 2013-07-29 ENCOUNTER — Other Ambulatory Visit: Payer: Self-pay | Admitting: Hematology and Oncology

## 2013-07-29 ENCOUNTER — Encounter: Payer: Self-pay | Admitting: Hematology and Oncology

## 2013-07-29 VITALS — BP 140/76 | HR 64 | Temp 98.2°F | Resp 18 | Ht 73.0 in | Wt 236.9 lb

## 2013-07-29 DIAGNOSIS — Z23 Encounter for immunization: Secondary | ICD-10-CM

## 2013-07-29 DIAGNOSIS — Z85828 Personal history of other malignant neoplasm of skin: Secondary | ICD-10-CM

## 2013-07-29 DIAGNOSIS — C8589 Other specified types of non-Hodgkin lymphoma, extranodal and solid organ sites: Secondary | ICD-10-CM

## 2013-07-29 DIAGNOSIS — C8510 Unspecified B-cell lymphoma, unspecified site: Secondary | ICD-10-CM

## 2013-07-29 MED ORDER — PNEUMOCOCCAL VAC POLYVALENT 25 MCG/0.5ML IJ INJ
0.5000 mL | INJECTION | Freq: Once | INTRAMUSCULAR | Status: AC
  Administered 2013-07-29: 0.5 mL via INTRAMUSCULAR
  Filled 2013-07-29: qty 0.5

## 2013-07-29 NOTE — Progress Notes (Signed)
[facility] progress notes  Patient has no care team.  CHIEF COMPLAINTS/PURPOSE OF VISIT:  Follicular lymphom  HISTORY OF PRESENTING ILLNESS:  Frank Walsh 63 y.o. male was transferred to my care after his prior physician has left.  I reviewed the patient's records extensive and collaborated the history with the patient. 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 CT scan show disease within the lymph nodes in the pelvis and retroperitoneum. He received 6 cycles of R. CHOP chemotherapy without long-term complications. He also had history of skin cancer and follows with a dermatologist regularly.  He denies any recent fever, chills, night sweats or abnormal weight loss He denies new skin lesions or lymphadenopathy.  MEDICAL HISTORY:  Past Medical History  Diagnosis Date  . Low grade B-cell lymphoma 06/19/2011    IA  IPI low risk  Follicular grade 2, B cell  NHL dx Jan, 2001 right inguinal adenopathy  . Herpes zoster infection of thoracic region 06/19/2011    March, 2012  . Hyperlipidemia, mixed 06/19/2011  . Leukopenia 07/01/2012  . Thrombocytopenia, unspecified 07/01/2012  . Skin cancer     basal cell and squamous cell    SURGICAL HISTORY: Past Surgical History  Procedure Laterality Date  . Ln biopsy      SOCIAL HISTORY: History   Social History  . Marital Status: Single    Spouse Name: N/A    Number of Children: N/A  . Years of Education: N/A   Occupational History  . Not on file.   Social History Main Topics  . Smoking status: Never Smoker   . Smokeless tobacco: Never Used  . Alcohol Use: No     Comment: rare  . Drug Use: No  . Sexual Activity: Not on file   Other Topics Concern  . Not on file   Social History Narrative  . No narrative on file    FAMILY HISTORY: History reviewed. No pertinent family history.  ALLERGIES:  has No  Known Allergies.  MEDICATIONS:  Current Outpatient Prescriptions  Medication Sig Dispense Refill  . aspirin 81 MG tablet Take 81 mg by mouth daily.      Marland Kitchen atorvastatin (LIPITOR) 10 MG tablet Take 10 mg by mouth daily.      . Calcium-Magnesium-Vitamin D (CALCIUM MAGNESIUM PO) Take by mouth daily.      . Cholecalciferol (VITAMIN D) 2000 UNITS CAPS Take 1 capsule by mouth daily.      . Lutein 20 MG CAPS Take 1 capsule by mouth daily.      . Multiple Vitamins-Minerals (MULTIVITAMIN PO) Take by mouth daily.      . Omega-3 Fatty Acids (OMEGA 3 PO) Take by mouth daily.      . Probiotic Product (PROBIOTIC DAILY PO) Take by mouth.       Current Facility-Administered Medications  Medication Dose Route Frequency Provider Last Rate Last Dose  . pneumococcal 23 valent vaccine (PNU-IMMUNE) injection 0.5 mL  0.5 mL Intramuscular Once Heath Lark, MD        REVIEW OF SYSTEMS:   Constitutional: Denies fevers, chills or abnormal night sweats Eyes: Denies blurriness of vision, double vision or watery eyes Ears, nose, mouth, throat, and face: Denies mucositis or sore throat Respiratory: Denies cough, dyspnea or wheezes Cardiovascular: Denies palpitation, chest discomfort or lower extremity swelling Gastrointestinal:  Denies nausea, heartburn or change in bowel habits Skin: Denies abnormal skin rashes Lymphatics: Denies  new lymphadenopathy or easy bruising Neurological:Denies numbness, tingling or new weaknesses Behavioral/Psych: Mood is stable, no new changes  All other systems were reviewed with the patient and are negative.  PHYSICAL EXAMINATION: ECOG PERFORMANCE STATUS: 0 - Asymptomatic  Filed Vitals:   07/29/13 0828  BP: 140/76  Pulse: 64  Temp: 98.2 F (36.8 C)  Resp: 18   Filed Weights   07/29/13 0828  Weight: 236 lb 14.4 oz (107.457 kg)    GENERAL:alert, no distress and comfortable. He is morbidly obese  SKIN: skin color, texture, turgor are normal, no rashes or significant  lesions EYES: normal, conjunctiva are pink and non-injected, sclera clear OROPHARYNX:no exudate, normal lips, buccal mucosa, and tongue  NECK: supple, thyroid normal size, non-tender, without nodularity LYMPH:  no palpable lymphadenopathy in the cervical, axillary or inguinal LUNGS: clear to auscultation and percussion with normal breathing effort HEART: regular rate & rhythm and no murmurs without lower extremity edema ABDOMEN:abdomen soft, non-tender and normal bowel sounds Musculoskeletal:no cyanosis of digits and no clubbing . Dupuytren's contracture is noted  PSYCH: alert & oriented x 3 with fluent speech NEURO: no focal motor/sensory deficits  LABORATORY DATA:  I have reviewed the data as listed Lab Results  Component Value Date   WBC 4.9 07/29/2012   HGB 14.5 07/29/2012   HCT 43.3 07/29/2012   MCV 96.9 07/29/2012   PLT 143 07/29/2012   No results found for this basename: NA, K, CL, CO2, GLUCOSE, BUN, CREATININE, CALCIUM, GFRNONAA, GFRAA, PROT, ALBUMIN, AST, ALT, ALKPHOS, BILITOT, BILIDIR, IBILI,  in the last 8760 hours  ASSESSMENT & PLAN:  Low grade B-cell lymphoma Clinically, 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 The patient wants to come back here to be examined on a daily basis even though he is 14 years away from prior treatment and remain in remission. I recommend yearly history, physical examination and blood work. For this year, he would get blood work done in his primary care physician office. We discussed about preventive care with influenza vaccination and pneumococcal vaccination. He will get pneumococcal vaccination today. I also reinforced skin examination due to the risk of skin cancer. He follows with her dermatologist yearly.  History of skin cancer The patient is avoiding excessive sun exposure. He sees a Paediatric nurse on a yearly basis.   No orders of the defined types were placed in this encounter.    All questions were answered. The patient knows to call the clinic with  any problems, questions or concerns. I spent 15 minutes counseling the patient face to face. The total time spent in the appointment was 20 minutes and more than 50% was on counseling.     [facility], [place], MD 07/29/2013 8:49 AM

## 2013-07-29 NOTE — Assessment & Plan Note (Signed)
Clinically, 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 The patient wants to come back here to be examined on a daily basis even though he is 14 years away from prior treatment and remain in remission. I recommend yearly history, physical examination and blood work. For this year, he would get blood work done in his primary care physician office. We discussed about preventive care with influenza vaccination and pneumococcal vaccination. He will get pneumococcal vaccination today. I also reinforced skin examination due to the risk of skin cancer. He follows with her dermatologist yearly.

## 2013-07-29 NOTE — Telephone Encounter (Signed)
Spk w/Jennie per pt confirmed labs/ov per 07/21 POF, gave pt AVS and will mail new schedule to pt.Marland Kitchen..KJ

## 2013-07-29 NOTE — Assessment & Plan Note (Signed)
The patient is avoiding excessive sun exposure. He sees a dermatologist on a yearly basis.   

## 2014-08-03 ENCOUNTER — Other Ambulatory Visit: Payer: Self-pay | Admitting: Hematology and Oncology

## 2014-08-03 DIAGNOSIS — C8510 Unspecified B-cell lymphoma, unspecified site: Secondary | ICD-10-CM

## 2014-08-04 ENCOUNTER — Other Ambulatory Visit (HOSPITAL_BASED_OUTPATIENT_CLINIC_OR_DEPARTMENT_OTHER): Payer: Medicare Other

## 2014-08-04 ENCOUNTER — Telehealth: Payer: Self-pay | Admitting: *Deleted

## 2014-08-04 ENCOUNTER — Telehealth: Payer: Self-pay | Admitting: Hematology and Oncology

## 2014-08-04 ENCOUNTER — Encounter: Payer: Self-pay | Admitting: Hematology and Oncology

## 2014-08-04 ENCOUNTER — Ambulatory Visit (HOSPITAL_BASED_OUTPATIENT_CLINIC_OR_DEPARTMENT_OTHER): Payer: Medicare Other | Admitting: Hematology and Oncology

## 2014-08-04 VITALS — BP 158/77 | HR 66 | Temp 97.4°F | Resp 18 | Ht 73.0 in | Wt 243.6 lb

## 2014-08-04 DIAGNOSIS — Z85828 Personal history of other malignant neoplasm of skin: Secondary | ICD-10-CM | POA: Diagnosis not present

## 2014-08-04 DIAGNOSIS — R739 Hyperglycemia, unspecified: Secondary | ICD-10-CM

## 2014-08-04 DIAGNOSIS — C8510 Unspecified B-cell lymphoma, unspecified site: Secondary | ICD-10-CM

## 2014-08-04 DIAGNOSIS — Z8572 Personal history of non-Hodgkin lymphomas: Secondary | ICD-10-CM

## 2014-08-04 LAB — CBC WITH DIFFERENTIAL/PLATELET
BASO%: 0.7 % (ref 0.0–2.0)
Basophils Absolute: 0 10*3/uL (ref 0.0–0.1)
EOS ABS: 0.2 10*3/uL (ref 0.0–0.5)
EOS%: 3.8 % (ref 0.0–7.0)
HEMATOCRIT: 42.6 % (ref 38.4–49.9)
HGB: 14.4 g/dL (ref 13.0–17.1)
LYMPH#: 0.8 10*3/uL — AB (ref 0.9–3.3)
LYMPH%: 18.2 % (ref 14.0–49.0)
MCH: 33.1 pg (ref 27.2–33.4)
MCHC: 33.8 g/dL (ref 32.0–36.0)
MCV: 98.1 fL — AB (ref 79.3–98.0)
MONO#: 0.3 10*3/uL (ref 0.1–0.9)
MONO%: 7.5 % (ref 0.0–14.0)
NEUT#: 2.9 10*3/uL (ref 1.5–6.5)
NEUT%: 69.8 % (ref 39.0–75.0)
Platelets: 156 10*3/uL (ref 140–400)
RBC: 4.35 10*6/uL (ref 4.20–5.82)
RDW: 13.2 % (ref 11.0–14.6)
WBC: 4.2 10*3/uL (ref 4.0–10.3)

## 2014-08-04 LAB — COMPREHENSIVE METABOLIC PANEL (CC13)
ALK PHOS: 78 U/L (ref 40–150)
ALT: 37 U/L (ref 0–55)
AST: 27 U/L (ref 5–34)
Albumin: 3.7 g/dL (ref 3.5–5.0)
Anion Gap: 7 mEq/L (ref 3–11)
BILIRUBIN TOTAL: 0.74 mg/dL (ref 0.20–1.20)
BUN: 11.7 mg/dL (ref 7.0–26.0)
CO2: 25 mEq/L (ref 22–29)
CREATININE: 1 mg/dL (ref 0.7–1.3)
Calcium: 8.7 mg/dL (ref 8.4–10.4)
Chloride: 106 mEq/L (ref 98–109)
EGFR: 78 mL/min/{1.73_m2} — ABNORMAL LOW (ref >90)
Glucose: 322 mg/dl — ABNORMAL HIGH (ref 70–140)
POTASSIUM: 4.4 meq/L (ref 3.5–5.1)
Sodium: 138 mEq/L (ref 136–145)
TOTAL PROTEIN: 6.1 g/dL — AB (ref 6.4–8.3)

## 2014-08-04 LAB — LACTATE DEHYDROGENASE (CC13): LDH: 164 U/L (ref 125–245)

## 2014-08-04 NOTE — Telephone Encounter (Signed)
Gave and printed appt sched and avs for pt for July 2017 °

## 2014-08-04 NOTE — Assessment & Plan Note (Signed)
Clinically, 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 The patient wants to come back here to be examined on a daily basis even though he is 15 years away from prior treatment and remain in remission. I recommend yearly history, physical examination and blood work. We discussed about preventive care with influenza vaccination and pneumococcal vaccination. I also reinforced skin examination due to the risk of skin cancer. He follows with his dermatologist yearly.

## 2014-08-04 NOTE — Assessment & Plan Note (Signed)
His blood sugars very high. I am concerned about possibly undiagnosed diabetes. I recommend he consults with his primary care doctor for further evaluation.

## 2014-08-04 NOTE — Telephone Encounter (Signed)
-----   Message from Heath Lark, MD sent at 08/04/2014  9:39 AM EDT ----- Regarding: very high blood sugar His blood sugar is very high and that result, even though non fasting is considered diabetic range Recommend him to make appt to see PCP for management ----- Message -----    From: [facility]    Sent: 08/04/2014   8:12 AM      To: Heath Lark, MD

## 2014-08-04 NOTE — Telephone Encounter (Signed)
Notified of message below

## 2014-08-04 NOTE — Progress Notes (Signed)
[facility] OFFICE PROGRESS NOTE  Patient Care Team: Josetta Huddle, MD as PCP - General (Internal Medicine)  SUMMARY OF ONCOLOGIC HISTORY: Frank Walsh was transferred to my care after his prior physician has left.  I reviewed the patient's records extensive and collaborated the history with the patient. 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 CT scan show disease within the lymph nodes in the pelvis and retroperitoneum. He received 6 cycles of R. CHOP chemotherapy without long-term complications. He also had history of skin cancer and follows with a dermatologist regularly.   INTERVAL HISTORY: Please see below for problem oriented charting. He denies any recent fever, chills, night sweats or abnormal weight loss He denies new skin lesions or lymphadenopathy.  REVIEW OF SYSTEMS:   Constitutional: Denies fevers, chills or abnormal weight loss Eyes: Denies blurriness of vision Ears, nose, mouth, throat, and face: Denies mucositis or sore throat Respiratory: Denies cough, dyspnea or wheezes Cardiovascular: Denies palpitation, chest discomfort or lower extremity swelling Gastrointestinal:  Denies nausea, heartburn or change in bowel habits Skin: Denies abnormal skin rashes Lymphatics: Denies new lymphadenopathy or easy bruising Neurological:Denies numbness, tingling or new weaknesses Behavioral/Psych: Mood is stable, no new changes  All other systems were reviewed with the patient and are negative.  I have reviewed the past medical history, past surgical history, social history and family history with the patient and they are unchanged from previous note.  ALLERGIES:  has No Known Allergies.  MEDICATIONS:  Current Outpatient Prescriptions  Medication Sig Dispense Refill  . aspirin 81 MG tablet Take 81 mg by mouth daily.    Marland Kitchen atorvastatin (LIPITOR) 10 MG tablet Take  10 mg by mouth daily.    . Multiple Vitamins-Minerals (MULTIVITAMIN PO) Take by mouth daily.    . Lutein 20 MG CAPS Take 1 capsule by mouth daily.    . Omega-3 Fatty Acids (OMEGA 3 PO) Take by mouth daily.    . Probiotic Product (PROBIOTIC DAILY PO) Take by mouth.     No current facility-administered medications for this visit.    PHYSICAL EXAMINATION: ECOG PERFORMANCE STATUS: 0 - Asymptomatic  Filed Vitals:   08/04/14 0807  BP: 158/77  Pulse: 66  Temp: 97.4 F (36.3 C)  Resp: 18   Filed Weights   08/04/14 0807  Weight: 243 lb 9.6 oz (110.496 kg)    GENERAL:alert, no distress and comfortable SKIN: skin color, texture, turgor are normal, no rashes or significant lesions EYES: normal, Conjunctiva are pink and non-injected, sclera clear OROPHARYNX:no exudate, no erythema and lips, buccal mucosa, and tongue normal  NECK: supple, thyroid normal size, non-tender, without nodularity LYMPH:  no palpable lymphadenopathy in the cervical, axillary or inguinal LUNGS: clear to auscultation and percussion with normal breathing effort HEART: regular rate & rhythm and no murmurs and no lower extremity edema ABDOMEN:abdomen soft, non-tender and normal bowel sounds Musculoskeletal:no cyanosis of digits and no clubbing  NEURO: alert & oriented x 3 with fluent speech, no focal motor/sensory deficits  LABORATORY DATA:  I have reviewed the data as listed    Component Value Date/Time   NA 138 08/04/2014 0754   NA 139 06/19/2011 1240   K 4.4 08/04/2014 0754   K 4.2 06/19/2011 1240   CL 107 07/01/2012 1153   CL 103 06/19/2011 1240   CO2 25 08/04/2014 0754   CO2 28 06/19/2011 1240   GLUCOSE 322* 08/04/2014 0754   GLUCOSE  102* 07/01/2012 1153   GLUCOSE 107* 06/19/2011 1240   BUN 11.7 08/04/2014 0754   BUN 16 06/19/2011 1240   CREATININE 1.0 08/04/2014 0754   CREATININE 0.90 06/19/2011 1240   CALCIUM 8.7 08/04/2014 0754   CALCIUM 9.6 06/19/2011 1240   PROT 6.1* 08/04/2014 0754   PROT  6.6 06/19/2011 1240   ALBUMIN 3.7 08/04/2014 0754   ALBUMIN 4.6 06/19/2011 1240   AST 27 08/04/2014 0754   AST 20 06/19/2011 1240   ALT 37 08/04/2014 0754   ALT 26 06/19/2011 1240   ALKPHOS 78 08/04/2014 0754   ALKPHOS 86 06/19/2011 1240   BILITOT 0.74 08/04/2014 0754   BILITOT 0.9 06/19/2011 1240    No results found for: SPEP, UPEP  Lab Results  Component Value Date   WBC 4.2 08/04/2014   NEUTROABS 2.9 08/04/2014   HGB 14.4 08/04/2014   HCT 42.6 08/04/2014   MCV 98.1* 08/04/2014   PLT 156 08/04/2014      Chemistry      Component Value Date/Time   NA 138 08/04/2014 0754   NA 139 06/19/2011 1240   K 4.4 08/04/2014 0754   K 4.2 06/19/2011 1240   CL 107 07/01/2012 1153   CL 103 06/19/2011 1240   CO2 25 08/04/2014 0754   CO2 28 06/19/2011 1240   BUN 11.7 08/04/2014 0754   BUN 16 06/19/2011 1240   CREATININE 1.0 08/04/2014 0754   CREATININE 0.90 06/19/2011 1240      Component Value Date/Time   CALCIUM 8.7 08/04/2014 0754   CALCIUM 9.6 06/19/2011 1240   ALKPHOS 78 08/04/2014 0754   ALKPHOS 86 06/19/2011 1240   AST 27 08/04/2014 0754   AST 20 06/19/2011 1240   ALT 37 08/04/2014 0754   ALT 26 06/19/2011 1240   BILITOT 0.74 08/04/2014 0754   BILITOT 0.9 06/19/2011 1240     ASSESSMENT & PLAN:  Low grade B-cell lymphoma Clinically, 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 The patient wants to come back here to be examined on a daily basis even though he is 15 years away from prior treatment and remain in remission. I recommend yearly history, physical examination and blood work. We discussed about preventive care with influenza vaccination and pneumococcal vaccination. I also reinforced skin examination due to the risk of skin cancer. He follows with his dermatologist yearly.    History of skin cancer The patient is avoiding excessive sun exposure. He sees a Paediatric nurse on a yearly basis.    Hyperglycemia His blood sugars very high. I am concerned about possibly  undiagnosed diabetes. I recommend he consults with his primary care doctor for further evaluation.   Orders Placed This Encounter  Procedures  . CBC with Differential/Platelet    Standing Status: Future     Number of Occurrences:      Standing Expiration Date: 09/08/2015  . Comprehensive metabolic panel    Standing Status: Future     Number of Occurrences:      Standing Expiration Date: 09/08/2015  . Lactate dehydrogenase    Standing Status: Future     Number of Occurrences:      Standing Expiration Date: 09/08/2015   All questions were answered. The patient knows to call the clinic with any problems, questions or concerns. No barriers to learning was detected. I spent 15 minutes counseling the patient face to face. The total time spent in the appointment was 20 minutes and more than 50% was on counseling and review of test results  [facility], [place], MD 08/04/2014 4:53 PM

## 2014-08-04 NOTE — Assessment & Plan Note (Signed)
The patient is avoiding excessive sun exposure. He sees a Paediatric nurse on a yearly basis.

## 2014-08-25 DIAGNOSIS — E782 Mixed hyperlipidemia: Secondary | ICD-10-CM | POA: Diagnosis not present

## 2014-08-25 DIAGNOSIS — R7309 Other abnormal glucose: Secondary | ICD-10-CM | POA: Diagnosis not present

## 2014-08-25 DIAGNOSIS — I10 Essential (primary) hypertension: Secondary | ICD-10-CM | POA: Diagnosis not present

## 2014-08-25 DIAGNOSIS — Z79899 Other long term (current) drug therapy: Secondary | ICD-10-CM | POA: Diagnosis not present

## 2014-09-16 DIAGNOSIS — R0782 Intercostal pain: Secondary | ICD-10-CM | POA: Diagnosis not present

## 2014-09-16 DIAGNOSIS — I10 Essential (primary) hypertension: Secondary | ICD-10-CM | POA: Diagnosis not present

## 2014-09-16 DIAGNOSIS — E119 Type 2 diabetes mellitus without complications: Secondary | ICD-10-CM | POA: Diagnosis not present

## 2014-09-16 DIAGNOSIS — Z6832 Body mass index (BMI) 32.0-32.9, adult: Secondary | ICD-10-CM | POA: Diagnosis not present

## 2014-09-16 DIAGNOSIS — E782 Mixed hyperlipidemia: Secondary | ICD-10-CM | POA: Diagnosis not present

## 2014-09-16 DIAGNOSIS — Z6830 Body mass index (BMI) 30.0-30.9, adult: Secondary | ICD-10-CM | POA: Diagnosis not present

## 2014-09-16 DIAGNOSIS — E669 Obesity, unspecified: Secondary | ICD-10-CM | POA: Diagnosis not present

## 2014-10-16 ENCOUNTER — Encounter (HOSPITAL_COMMUNITY): Payer: Medicare Other

## 2014-10-27 ENCOUNTER — Encounter: Payer: Medicare Other | Attending: Internal Medicine | Admitting: Dietician

## 2014-10-27 ENCOUNTER — Encounter: Payer: Self-pay | Admitting: Dietician

## 2014-10-27 VITALS — Ht 73.0 in | Wt 234.0 lb

## 2014-10-27 DIAGNOSIS — R7303 Prediabetes: Secondary | ICD-10-CM | POA: Diagnosis not present

## 2014-10-27 DIAGNOSIS — Z713 Dietary counseling and surveillance: Secondary | ICD-10-CM | POA: Insufficient documentation

## 2014-10-27 NOTE — Progress Notes (Signed)
  Medical Nutrition Therapy:  Appt start time: 0600 end time:  1830.   Assessment:  Primary concerns today: Johney is here today since his glucose had been running "high" though not fasting (~108 mg/dl) and recently had reading of 250 mg/dl after having some cake. Has a family hx of diabetes. Started Metformin and cutting back on sugar/carbs (sweet tea). In past 3 weeks glucose dropped to 104 mg/dl and lost about 15-20 lbs. Had a Hgb A1c done he thinks but not sure of the number but it was elevated. Cholesterol was higher but is controlled with a statin.   He works as a Pharmacist, community. Lives by himself and hates to [employer]. Does a lot of fast food and take out food (most lunch and dinner) Might skip a meal but not regularly.   Has a busy schedule. Goes to [employer] each weekend and belong to country club. Hasn't had much time to exercise. Does not drink  alcohol.   Would like to to lose 25 more pounds. Has lost weight before on fad diets but has not been able to maintain weight loss.   Preferred Learning Style:   No preference indicated   Learning Readiness:   Ready  MEDICATIONS: Metformin   DIETARY INTAKE:  Usual eating pattern includes 3 meals and 0  snacks per day.  Avoided foods include: coconut, green peas, organ meats   24-hr recall:  B ( AM): 3-4 eggs and bacon with banana, cantaloupe and water  Snk ( AM): none L ( PM): Dennys marinated chicken breast with vegetables or hamburger steak or smoked [place] with celery and carrots (Mondays) Snk ( PM): none D ( PM): BBQ or [place] or [employer] or meatball sub  Snk ( PM): not usually  Beverages: water  Usual physical activity: no regular activity  Estimated energy needs: 2000 calories 225 g carbohydrates 150 g protein 56 g fat  Progress Towards Goal(s):  In progress.   Nutritional Diagnosis:  [place]-2.2 Altered nutrition-related laboratory As related to hx of excess consumption of carbohydrates .  As evidenced by recent elevated blood  glucose readings.    Intervention:  Nutrition counseling provided. Plan: Aim to eat 45-60 gram of carbs per meal. If you are hungry, have 0-15 grams carbs at snacks. Eat protein with carbs at all meals and snacks. Aim to fill half of your plate with vegetables at lunch and dinner meals. Limit carbs (starches/fruit) to about a quarter of your plate or choose one starch per meal.  Check out https://www.mymetabolicmeals.com/ for delivered dinners.  Teaching Method Utilized:  Visual Auditory Hands on  Handouts given during visit include:  Living Well with Diabetes  Meal card  MyPlate Handout  Blood glucose monitoring  Barriers to learning/adherence to lifestyle change: doesn't like to cook  Demonstrated degree of understanding via:  Teach Back   Monitoring/Evaluation:  Dietary intake, exercise,  and body weight prn.

## 2014-10-27 NOTE — Patient Instructions (Signed)
Aim to eat 45-60 gram of carbs per meal. If you are hungry, have 0-15 grams carbs at snacks. Eat protein with carbs at all meals and snacks. Aim to fill half of your plate with vegetables at lunch and dinner meals. Limit carbs (starches/fruit) to about a quarter of your plate or choose one starch per meal.  Check out https://www.mymetabolicmeals.com/ for delivered dinners.

## 2014-10-30 ENCOUNTER — Telehealth (HOSPITAL_COMMUNITY): Payer: Self-pay

## 2014-10-30 NOTE — Telephone Encounter (Signed)
Encounter complete. 

## 2014-11-03 ENCOUNTER — Other Ambulatory Visit (HOSPITAL_COMMUNITY): Payer: Self-pay | Admitting: Internal Medicine

## 2014-11-03 ENCOUNTER — Telehealth (HOSPITAL_COMMUNITY): Payer: Self-pay

## 2014-11-03 DIAGNOSIS — R0782 Intercostal pain: Secondary | ICD-10-CM

## 2014-11-03 NOTE — Telephone Encounter (Signed)
Encounter complete. 

## 2014-11-04 ENCOUNTER — Ambulatory Visit (HOSPITAL_COMMUNITY)
Admission: RE | Admit: 2014-11-04 | Discharge: 2014-11-04 | Disposition: A | Discharge status: Home / Self Care | Payer: Medicare Other | Source: Ambulatory Visit | Attending: Cardiology | Admitting: Cardiology

## 2014-11-04 DIAGNOSIS — R0782 Intercostal pain: Secondary | ICD-10-CM | POA: Insufficient documentation

## 2014-11-04 LAB — EXERCISE TOLERANCE TEST
CHL CUP RESTING HR STRESS: 73 {beats}/min
CHL RATE OF PERCEIVED EXERTION: 15
CSEPED: 9 min
Estimated workload: 10.1 METS
MPHR: 155 {beats}/min
Peak HR: 153 {beats}/min
Percent HR: 98 %

## 2014-11-20 DIAGNOSIS — Z23 Encounter for immunization: Secondary | ICD-10-CM | POA: Diagnosis not present

## 2014-12-07 ENCOUNTER — Telehealth: Payer: Self-pay | Admitting: Cardiology

## 2014-12-07 NOTE — Telephone Encounter (Signed)
Received records from [place] for appointment with Dr Percival Spanish on 12/17/14.  Records given to [facility] (medical records) for Dr Hocrhein's schedule on 12/17/14 lp

## 2014-12-17 ENCOUNTER — Encounter: Payer: Self-pay | Admitting: Cardiology

## 2014-12-17 ENCOUNTER — Ambulatory Visit (INDEPENDENT_AMBULATORY_CARE_PROVIDER_SITE_OTHER): Payer: Medicare Other | Admitting: Cardiology

## 2014-12-17 VITALS — BP 150/88 | HR 70 | Ht 73.0 in | Wt 231.1 lb

## 2014-12-17 DIAGNOSIS — R079 Chest pain, unspecified: Secondary | ICD-10-CM | POA: Diagnosis not present

## 2014-12-17 DIAGNOSIS — R9439 Abnormal result of other cardiovascular function study: Secondary | ICD-10-CM

## 2014-12-17 DIAGNOSIS — R0602 Shortness of breath: Secondary | ICD-10-CM | POA: Diagnosis not present

## 2014-12-17 NOTE — Progress Notes (Signed)
Cardiology Office Note   Date:  12/17/2014   ID:  Frank Walsh, DOB May 14, 1950, MRN [id number]  PCP:  Henrine Screws, MD  Cardiologist:   Minus Breeding, MD   Chief Complaint  Patient presents with  . Abnormal Stress Test      History of Present Illness: Frank Walsh is a 64 y.o. male who presents for evaluation of an abnormal stress test.  However, he does have risk factors with DM.  He was recently screened with a POET (Plain Old Exercise Treadmill).  This was interpreted as possibly abnormal with ST depression at peak exercise.  However, he had no symptoms with this.  He has no chest discomfort, neck or arm discomfort. He doesn't really notice palpitations, presyncope or syncope. He doesn't exercise routinely but he does things like cleaning his boat and he brings on no symptoms. He does have a vague sensation in his chest that is not particularly palpitations is worried about atrial fibrillation as he has acquaintances who has had this. He's never had any presyncope or syncope. He denies any shortness of breath, PND or orthopnea.  Past Medical History  Diagnosis Date  . Low grade B-cell lymphoma (HCC)     [place]  IPI low risk  Follicular grade 2, B cell  NHL dx Jan, 2001 right inguinal adenopathy  . Herpes zoster infection of thoracic region     March, 2012  . Hyperlipidemia, mixed   . Leukopenia   . Thrombocytopenia, unspecified ([place])   . Skin cancer     basal cell and squamous cell    Past Surgical History  Procedure Laterality Date  . Ln biopsy    . Dupuytren / palmar fasciotomy       Current Outpatient Prescriptions  Medication Sig Dispense Refill  . aspirin 81 MG tablet Take 81 mg by mouth daily.    Marland Kitchen atorvastatin (LIPITOR) 10 MG tablet Take 10 mg by mouth daily.    . metFORMIN (GLUCOPHAGE) 500 MG tablet Take 500 mg by mouth daily.  1  . Multiple Vitamins-Minerals (MULTIVITAMIN PO) Take by mouth daily.     No current facility-administered  medications for this visit.    Allergies:   Review of patient's allergies indicates no known allergies.    Social History:  The patient  reports that he has never smoked. He has never used smokeless tobacco. He reports that he does not drink alcohol or use illicit drugs.   Family History:  The patient's family history includes Alzheimer's disease in his father; CAD in his brother.    ROS:  Please see the history of present illness.   Otherwise, review of systems are positive for none.   All other systems are reviewed and negative.    PHYSICAL EXAM: VS:  BP 150/88 mmHg  Pulse 70  Ht 6\' 1"  (1.854 m)  Wt 231 lb 1.6 oz (104.826 kg)  BMI 30.50 kg/m2 , BMI Body mass index is 30.5 kg/(m^2). GENERAL:  Well appearing HEENT:  Pupils equal round and reactive, fundi not visualized, oral mucosa unremarkable NECK:  No jugular venous distention, waveform within normal limits, carotid upstroke brisk and symmetric, no bruits, no thyromegaly LYMPHATICS:  No cervical, inguinal adenopathy LUNGS:  Clear to auscultation bilaterally BACK:  No CVA tenderness CHEST:  Unremarkable HEART:  PMI not displaced or sustained,S1 and S2 within normal limits, no S3, no S4, no clicks, no rubs, no murmurs ABD:  Flat, positive bowel sounds normal in frequency in pitch,  no bruits, no rebound, no guarding, no midline pulsatile mass, no hepatomegaly, no splenomegaly EXT:  2 plus pulses throughout, no edema, no cyanosis no clubbing SKIN:  No rashes no nodules NEURO:  Cranial nerves II through XII grossly intact, motor grossly intact throughout PSYCH:  Cognitively intact, oriented to person place and time    EKG:  EKG is not ordered today.    Recent Labs: 08/04/2014: ALT 37; BUN 11.7; Creatinine 1.0; HGB 14.4; Platelets 156; Potassium 4.4; Sodium 138    Lipid Panel No results found for: CHOL, TRIG, HDL, CHOLHDL, VLDL, LDLCALC, LDLDIRECT    Wt Readings from Last 3 Encounters:  12/17/14 231 lb 1.6 oz (104.826 kg)   10/27/14 234 lb (106.142 kg)  08/04/14 243 lb 9.6 oz (110.496 kg)      Other studies Reviewed: Additional studies/ records that were reviewed today include:  POET (Plain Old Exercise Treadmill). Review of the above records demonstrates:  Please see elsewhere in the note.     ASSESSMENT AND PLAN:  ABNORMAL STRESS TEST:  I personally reviewed the tracings. I think this was an equivocal test but not highly positive for ischemia and certainly not a high risk study. The next step will be a coronary calcium score and then decision on further testing and risk reduction strategies.   PALPITATIONS:  The patient is a vague history of palpitations. However, I wouldn't suspect atrial fibrillation. This is just a concern of his with no particular reason to think he would be at risk for this. I have instructed him on how to take his pulse and pulse oximeter and if he has any irregular or rapid rates to let me know which point I would have a low threshold for a monitor.   Current medicines are reviewed at length with the patient today.  The patient does not have concerns regarding medicines.  The following changes have been made:  no change  Labs/ tests ordered today include:   Orders Placed This Encounter  Procedures  . CT Cardiac Scoring     Disposition:   FU with me as needed.      Signed, Minus Breeding, MD  12/17/2014 3:55 PM    [place]

## 2014-12-17 NOTE — Patient Instructions (Signed)
Your physician recommends that you schedule a follow-up appointment in: As Needed  Your physician recommends you have a Coronary Calcium Scoring Test, this is done at our [facility]

## 2015-01-15 ENCOUNTER — Ambulatory Visit (INDEPENDENT_AMBULATORY_CARE_PROVIDER_SITE_OTHER)
Admission: RE | Admit: 2015-01-15 | Discharge: 2015-01-15 | Disposition: A | Discharge status: Home / Self Care | Payer: Self-pay | Source: Ambulatory Visit | Attending: Cardiology | Admitting: Cardiology

## 2015-01-15 DIAGNOSIS — I10 Essential (primary) hypertension: Secondary | ICD-10-CM | POA: Diagnosis not present

## 2015-01-15 DIAGNOSIS — R9439 Abnormal result of other cardiovascular function study: Secondary | ICD-10-CM

## 2015-01-15 DIAGNOSIS — E782 Mixed hyperlipidemia: Secondary | ICD-10-CM | POA: Diagnosis not present

## 2015-01-15 DIAGNOSIS — E119 Type 2 diabetes mellitus without complications: Secondary | ICD-10-CM | POA: Diagnosis not present

## 2015-01-15 DIAGNOSIS — R0602 Shortness of breath: Secondary | ICD-10-CM

## 2015-01-15 DIAGNOSIS — R079 Chest pain, unspecified: Secondary | ICD-10-CM

## 2015-01-15 DIAGNOSIS — Z7984 Long term (current) use of oral hypoglycemic drugs: Secondary | ICD-10-CM | POA: Diagnosis not present

## 2015-02-02 ENCOUNTER — Telehealth: Payer: Self-pay | Admitting: Cardiology

## 2015-02-02 NOTE — Telephone Encounter (Signed)
Left message to call.

## 2015-02-02 NOTE — Telephone Encounter (Signed)
New Message  Pt called states that he received a call after hours and he missed the call. Request a call back to discuss CT results

## 2015-02-02 NOTE — Telephone Encounter (Signed)
I tried to call him several times and could only get an office answering machine.  I left one message.  I would like to have a cell or home number to call back please.

## 2015-02-03 NOTE — Telephone Encounter (Signed)
Called patient back at office # [phone]  He is there until ~4:30 today.  Can call back there.  Also left home phone # [phone]  Will defer to Dr. Percival Spanish to discuss results interpretation.

## 2015-02-16 ENCOUNTER — Telehealth: Payer: Self-pay | Admitting: Cardiology

## 2015-02-16 NOTE — Telephone Encounter (Signed)
Calcium CT results not released by Dr. Percival Spanish yet Printed out results and delivered to Dr. Percival Spanish for review After his review patient needs to be called with results; before 430pm  02/16/2015 10:43 AM Phone (Incoming) Frank Walsh (Self)    Phone: [phone] (H)

## 2015-02-16 NOTE — Telephone Encounter (Signed)
New message  ° ° °Patient calling for test results.   °

## 2015-02-16 NOTE — Telephone Encounter (Signed)
Left voicemail message to call back  

## 2015-02-16 NOTE — Telephone Encounter (Signed)
Follow up  ° ° °Patient returning call back to nurse  °

## 2015-02-18 NOTE — Telephone Encounter (Signed)
F/u  Pt following up on test results. Requested to speak w/ Dr Percival Spanish. Please call back and discuss.

## 2015-02-18 NOTE — Telephone Encounter (Signed)
Returned call to patient spoke to Dr.Hochrein he has called multiple times with no answer.He did not want to leave a message on work phone.Dr.Hochrein needs your cell #.Dr.Hochrein is in clinic all day, he will call you back on your cell phone.Cell # [phone].

## 2015-02-18 NOTE — Telephone Encounter (Signed)
I was finally able to call the patient and review the results of his calcium score.   We discussed the options of catheterization versus perfusion testing .he did have an abnormal treadmill test. I'm concerned about him having three-vessel CAD with his elevated calcium score.  I am concerned about the lower sensitivity of stress echo in this situation.   CT with angiography and flow reserve is not available.  He will need to have a cardiac cath.  The patient understands that risks included but are not limited to stroke (1 in 1000), death (1 in 8), kidney failure [usually temporary] (1 in 500), bleeding (1 in 200), allergic reaction [possibly serious] (1 in 200).  The patient understands and agrees to proceed.   He wants to have this next Friday.  We will need to call and arrange this.

## 2015-02-18 NOTE — Telephone Encounter (Signed)
Returned call to patient.Stated he would like results of Calcium CT.Advised Dr.Hochrein in clinic today.I will speak to him and call you back.

## 2015-02-19 NOTE — Telephone Encounter (Signed)
Theodore Demark, RN at 02/19/2015 9:16 AM     Status: Signed       Expand All Collapse All   Routed to [facility].            Minus Breeding, MD at 02/18/2015 5:54 PM     Status: Signed       Expand All Collapse All   I was finally able to call the patient and review the results of his calcium score. We discussed the options of catheterization versus perfusion testing .he did have an abnormal treadmill test. I'm concerned about him having three-vessel CAD with his elevated calcium score. I am concerned about the lower sensitivity of stress echo in this situation. CT with angiography and flow reserve is not available. He will need to have a cardiac cath. The patient understands that risks included but are not limited to stroke (1 in 1000), death (1 in 17), kidney failure [usually temporary] (1 in 500), bleeding (1 in 200), allergic reaction [possibly serious] (1 in 200). The patient understands and agrees to proceed.   He wants to have this next Friday. We will need to call and arrange this.         Lm to cb RE: Cardiac cath.  I was able to get it scheduled for 2/17 with Dr Irish Lack.  Pt needs to report to [facility] at 5:30 am for 7:30 am case.  He needs lab work and instructions.  LM on VM [phone]

## 2015-02-19 NOTE — Telephone Encounter (Signed)
Routed to [employer].

## 2015-02-22 ENCOUNTER — Other Ambulatory Visit: Payer: Self-pay | Admitting: *Deleted

## 2015-02-22 DIAGNOSIS — Z79899 Other long term (current) drug therapy: Secondary | ICD-10-CM

## 2015-02-22 DIAGNOSIS — Z01812 Encounter for preprocedural laboratory examination: Secondary | ICD-10-CM

## 2015-02-22 NOTE — Telephone Encounter (Signed)
F/u  Pt returning RN phone call. Please call back and discuss.   

## 2015-02-22 NOTE — Telephone Encounter (Signed)
Left message for patient to call back  

## 2015-02-22 NOTE — Telephone Encounter (Signed)
PT  AWARE  OF DATE  AND  TIME OF CATH .Frank Walsh

## 2015-02-22 NOTE — Telephone Encounter (Signed)
Follow up ° ° ° °Pt is calling back  °

## 2015-02-22 NOTE — Telephone Encounter (Signed)
Left message to call back  

## 2015-02-23 ENCOUNTER — Other Ambulatory Visit (INDEPENDENT_AMBULATORY_CARE_PROVIDER_SITE_OTHER): Payer: Medicare Other | Admitting: *Deleted

## 2015-02-23 DIAGNOSIS — Z01812 Encounter for preprocedural laboratory examination: Secondary | ICD-10-CM | POA: Diagnosis not present

## 2015-02-23 DIAGNOSIS — Z7901 Long term (current) use of anticoagulants: Secondary | ICD-10-CM | POA: Diagnosis not present

## 2015-02-23 DIAGNOSIS — Z79899 Other long term (current) drug therapy: Secondary | ICD-10-CM

## 2015-02-23 LAB — CBC WITH DIFFERENTIAL/PLATELET
BASOS PCT: 0 % (ref 0–1)
Basophils Absolute: 0 10*3/uL (ref 0.0–0.1)
EOS ABS: 0.1 10*3/uL (ref 0.0–0.7)
Eosinophils Relative: 3 % (ref 0–5)
HCT: 39.2 % (ref 39.0–52.0)
Hemoglobin: 13.8 g/dL (ref 13.0–17.0)
Lymphocytes Relative: 18 % (ref 12–46)
Lymphs Abs: 0.8 10*3/uL (ref 0.7–4.0)
MCH: 33.6 pg (ref 26.0–34.0)
MCHC: 35.2 g/dL (ref 30.0–36.0)
MCV: 95.4 fL (ref 78.0–100.0)
MONO ABS: 0.3 10*3/uL (ref 0.1–1.0)
MPV: 9.1 fL (ref 8.6–12.4)
Monocytes Relative: 6 % (ref 3–12)
NEUTROS ABS: 3.1 10*3/uL (ref 1.7–7.7)
Neutrophils Relative %: 73 % (ref 43–77)
PLATELETS: 115 10*3/uL — AB (ref 150–400)
RBC: 4.11 MIL/uL — AB (ref 4.22–5.81)
RDW: 13.6 % (ref 11.5–15.5)
WBC: 4.3 10*3/uL (ref 4.0–10.5)

## 2015-02-23 LAB — BASIC METABOLIC PANEL
BUN: 16 mg/dL (ref 7–25)
CALCIUM: 9.4 mg/dL (ref 8.6–10.3)
CHLORIDE: 104 mmol/L (ref 98–110)
CO2: 26 mmol/L (ref 20–31)
Creat: 0.97 mg/dL (ref 0.70–1.25)
Glucose, Bld: 156 mg/dL — ABNORMAL HIGH (ref 65–99)
Potassium: 4.4 mmol/L (ref 3.5–5.3)
SODIUM: 138 mmol/L (ref 135–146)

## 2015-02-23 LAB — HEMOGLOBIN A1C
Hgb A1c MFr Bld: 6.2 % — ABNORMAL HIGH (ref <5.7)
MEAN PLASMA GLUCOSE: 131 mg/dL — AB (ref <117)

## 2015-02-23 LAB — PROTIME-INR
INR: 1.06 (ref <1.50)
Prothrombin Time: 13.9 seconds (ref 11.6–15.2)

## 2015-02-26 ENCOUNTER — Encounter (HOSPITAL_COMMUNITY): Payer: Self-pay | Admitting: Interventional Cardiology

## 2015-02-26 ENCOUNTER — Encounter (HOSPITAL_COMMUNITY)
Admission: RE | Disposition: A | Discharge status: Home / Self Care | Payer: Self-pay | Source: Ambulatory Visit | Attending: Interventional Cardiology

## 2015-02-26 ENCOUNTER — Ambulatory Visit (HOSPITAL_COMMUNITY)
Admission: RE | Admit: 2015-02-26 | Discharge: 2015-02-26 | Disposition: A | Discharge status: Home / Self Care | Payer: Medicare Other | Source: Ambulatory Visit | Attending: Interventional Cardiology | Admitting: Interventional Cardiology

## 2015-02-26 DIAGNOSIS — Z7982 Long term (current) use of aspirin: Secondary | ICD-10-CM | POA: Diagnosis not present

## 2015-02-26 DIAGNOSIS — R9439 Abnormal result of other cardiovascular function study: Secondary | ICD-10-CM | POA: Diagnosis present

## 2015-02-26 DIAGNOSIS — Z8572 Personal history of non-Hodgkin lymphomas: Secondary | ICD-10-CM | POA: Diagnosis not present

## 2015-02-26 DIAGNOSIS — Z85828 Personal history of other malignant neoplasm of skin: Secondary | ICD-10-CM | POA: Diagnosis not present

## 2015-02-26 DIAGNOSIS — D696 Thrombocytopenia, unspecified: Secondary | ICD-10-CM | POA: Insufficient documentation

## 2015-02-26 DIAGNOSIS — Z8249 Family history of ischemic heart disease and other diseases of the circulatory system: Secondary | ICD-10-CM | POA: Diagnosis not present

## 2015-02-26 DIAGNOSIS — E782 Mixed hyperlipidemia: Secondary | ICD-10-CM | POA: Diagnosis not present

## 2015-02-26 DIAGNOSIS — I2510 Atherosclerotic heart disease of native coronary artery without angina pectoris: Secondary | ICD-10-CM

## 2015-02-26 DIAGNOSIS — Z7984 Long term (current) use of oral hypoglycemic drugs: Secondary | ICD-10-CM | POA: Insufficient documentation

## 2015-02-26 HISTORY — PX: CARDIAC CATHETERIZATION: SHX172

## 2015-02-26 LAB — GLUCOSE, CAPILLARY: Glucose-Capillary: 133 mg/dL — ABNORMAL HIGH (ref 65–99)

## 2015-02-26 SURGERY — LEFT HEART CATH AND CORONARY ANGIOGRAPHY
Anesthesia: LOCAL

## 2015-02-26 MED ORDER — FENTANYL CITRATE (PF) 100 MCG/2ML IJ SOLN
INTRAMUSCULAR | Status: AC
  Filled 2015-02-26: qty 2

## 2015-02-26 MED ORDER — SODIUM CHLORIDE 0.9% FLUSH: 3.0000 mL | INTRAVENOUS | Status: DC | PRN

## 2015-02-26 MED ORDER — HEPARIN (PORCINE) IN NACL 2-0.9 UNIT/ML-% IJ SOLN
INTRAMUSCULAR | Status: AC
  Filled 2015-02-26: qty 1000

## 2015-02-26 MED ORDER — VERAPAMIL HCL 2.5 MG/ML IV SOLN
INTRAVENOUS | Status: AC
  Filled 2015-02-26: qty 2

## 2015-02-26 MED ORDER — SODIUM CHLORIDE 0.9 % IV SOLN: 250.0000 mL | INTRAVENOUS | Status: DC | PRN

## 2015-02-26 MED ORDER — SODIUM CHLORIDE 0.9 % WEIGHT BASED INFUSION: 1.0000 mL/kg/h | INTRAVENOUS | Status: DC

## 2015-02-26 MED ORDER — HEPARIN (PORCINE) IN NACL 2-0.9 UNIT/ML-% IJ SOLN
INTRAMUSCULAR | Status: DC | PRN
  Administered 2015-02-26: 1000 mL

## 2015-02-26 MED ORDER — ASPIRIN 81 MG PO CHEW
81.0000 mg | CHEWABLE_TABLET | ORAL | Status: AC
  Administered 2015-02-26: 81 mg via ORAL

## 2015-02-26 MED ORDER — LIDOCAINE HCL (PF) 1 % IJ SOLN
INTRAMUSCULAR | Status: AC
  Filled 2015-02-26: qty 30

## 2015-02-26 MED ORDER — MIDAZOLAM HCL 2 MG/2ML IJ SOLN
INTRAMUSCULAR | Status: DC | PRN
  Administered 2015-02-26: 2 mg via INTRAVENOUS
  Administered 2015-02-26: 1 mg via INTRAVENOUS

## 2015-02-26 MED ORDER — SODIUM CHLORIDE 0.9% FLUSH: 3.0000 mL | Freq: Two times a day (BID) | INTRAVENOUS | Status: DC

## 2015-02-26 MED ORDER — HEPARIN SODIUM (PORCINE) 1000 UNIT/ML IJ SOLN
INTRAMUSCULAR | Status: AC
  Filled 2015-02-26: qty 1

## 2015-02-26 MED ORDER — METFORMIN HCL 500 MG PO TABS: 500.0000 mg | ORAL_TABLET | Freq: Every day | ORAL | Status: AC

## 2015-02-26 MED ORDER — LIDOCAINE HCL (PF) 1 % IJ SOLN
INTRAMUSCULAR | Status: DC | PRN
  Administered 2015-02-26: 2 mL

## 2015-02-26 MED ORDER — HEPARIN (PORCINE) IN NACL 2-0.9 UNIT/ML-% IJ SOLN
INTRAMUSCULAR | Status: DC | PRN
Start: 2015-02-26 — End: 2015-02-26
  Administered 2015-02-26: 10 mL via INTRA_ARTERIAL

## 2015-02-26 MED ORDER — SODIUM CHLORIDE 0.9 % WEIGHT BASED INFUSION
3.0000 mL/kg/h | INTRAVENOUS | Status: AC
  Administered 2015-02-26: 3 mL/kg/h via INTRAVENOUS

## 2015-02-26 MED ORDER — MIDAZOLAM HCL 2 MG/2ML IJ SOLN
INTRAMUSCULAR | Status: AC
  Filled 2015-02-26: qty 2

## 2015-02-26 MED ORDER — IOHEXOL 350 MG/ML SOLN
INTRAVENOUS | Status: DC | PRN
  Administered 2015-02-26: 90 mL via INTRAVENOUS

## 2015-02-26 MED ORDER — FENTANYL CITRATE (PF) 100 MCG/2ML IJ SOLN
INTRAMUSCULAR | Status: DC | PRN
  Administered 2015-02-26 (×2): 25 ug via INTRAVENOUS

## 2015-02-26 MED ORDER — ASPIRIN 81 MG PO CHEW
CHEWABLE_TABLET | ORAL | Status: AC
  Administered 2015-02-26: 81 mg via ORAL
  Filled 2015-02-26: qty 1

## 2015-02-26 MED ORDER — HEPARIN SODIUM (PORCINE) 1000 UNIT/ML IJ SOLN
INTRAMUSCULAR | Status: DC | PRN
  Administered 2015-02-26: 5000 [IU] via INTRAVENOUS

## 2015-02-26 SURGICAL SUPPLY — 9 items
CATH IMPULSE 5F ANG/FL3.5 (CATHETERS) ×1 IMPLANT
DEVICE RAD COMP TR BAND LRG (VASCULAR PRODUCTS) ×2 IMPLANT
GLIDESHEATH SLEND SS 6F .021 (SHEATH) ×2 IMPLANT
KIT HEART LEFT (KITS) ×2 IMPLANT
PACK CARDIAC CATHETERIZATION (CUSTOM PROCEDURE TRAY) ×2 IMPLANT
SYR MEDRAD MARK V 150ML (SYRINGE) ×2 IMPLANT
TRANSDUCER W/STOPCOCK (MISCELLANEOUS) ×2 IMPLANT
TUBING CIL FLEX 10 FLL-RA (TUBING) ×2 IMPLANT
WIRE SAFE-T 1.5MM-J .035X260CM (WIRE) ×2 IMPLANT

## 2015-02-26 NOTE — Progress Notes (Signed)
Dr Irish Lack notified H&P needed, will continue to monitor.

## 2015-02-26 NOTE — Discharge Instructions (Signed)
Radial Site Care Refer to this sheet in the next few weeks. These instructions provide you with information about caring for yourself after your procedure. Your health care provider may also give you more specific instructions. Your treatment has been planned according to current medical practices, but problems sometimes occur. Call your health care provider if you have any problems or questions after your procedure. WHAT TO EXPECT AFTER THE PROCEDURE After your procedure, it is typical to have the following:  Bruising at the radial site that usually fades within 1-2 weeks.  Blood collecting in the tissue (hematoma) that may be painful to the touch. It should usually decrease in size and tenderness within 1-2 weeks. HOME CARE INSTRUCTIONS  Take medicines only as directed by your health care provider.  You may shower 24-48 hours after the procedure or as directed by your health care provider. Remove the bandage (dressing) and gently wash the site with plain soap and water. Pat the area dry with a clean towel. Do not rub the site, because this may cause bleeding.  Do not take baths, swim, or use a hot tub until your health care provider approves.  Check your insertion site every day for redness, swelling, or drainage.  Do not apply powder or lotion to the site.  Do not flex or bend the affected arm for 24 hours or as directed by your health care provider.  Do not push or pull heavy objects with the affected arm for 24 hours or as directed by your health care provider.  Do not lift over 10 lb (4.5 kg) for 5 days after your procedure or as directed by your health care provider.  Ask your health care provider when it is okay to:  Return to work or school.  Resume usual physical activities or sports.  Resume sexual activity.  Do not drive home if you are discharged the same day as the procedure. Have someone else drive you.  You may drive 24 hours after the procedure unless otherwise  instructed by your health care provider.  Do not operate machinery or power tools for 24 hours after the procedure.  If your procedure was done as an outpatient procedure, which means that you went home the same day as your procedure, a responsible adult should be with you for the first 24 hours after you arrive home.  Keep all follow-up visits as directed by your health care provider. This is important. SEEK MEDICAL CARE IF:  You have a fever.  You have chills.  You have increased bleeding from the radial site. Hold pressure on the site. SEEK IMMEDIATE MEDICAL CARE IF:  You have unusual pain at the radial site.  You have redness, warmth, or swelling at the radial site.  You have drainage (other than a small amount of blood on the dressing) from the radial site.  The radial site is bleeding, and the bleeding does not stop after 30 minutes of holding steady pressure on the site.  Your arm or hand becomes pale, cool, tingly, or numb.   This information is not intended to replace advice given to you by your health care provider. Make sure you discuss any questions you have with your health care provider.   Document Released: 01/28/2010 Document Revised: 01/16/2014 Document Reviewed: 07/14/2013 Elsevier Interactive Patient Education 2016 [place] FOR 2 DAYS

## 2015-02-26 NOTE — H&P (Signed)
Frank Walsh is an 65 y.o. male.   Primary Cardiologist: PMD: Chief Complaint: abnormal stress test HPI: 65 year old man with family history of heart disease and diabetes who had an abnormal stress test. This was followed by an abnormal calcium scoring test. Three-vessel coronary calcium was present FFR of the CT was not available. He is sent here for cardiac catheterization.    Past Medical History  Diagnosis Date  . Low grade B-cell lymphoma (HCC)     [place]  IPI low risk  Follicular grade 2, B cell  NHL dx Jan, 2001 right inguinal adenopathy  . Herpes zoster infection of thoracic region     March, 2012  . Hyperlipidemia, mixed   . Leukopenia   . Thrombocytopenia, unspecified ([place])   . Skin cancer     basal cell and squamous cell    Past Surgical History  Procedure Laterality Date  . Ln biopsy    . Dupuytren / palmar fasciotomy      Family History  Problem Relation Age of Onset  . Alzheimer's disease Father   . CAD Brother     LIMA to the LAD   Social History:  reports that he has never smoked. He has never used smokeless tobacco. He reports that he does not drink alcohol or use illicit drugs.  Allergies: No Known Allergies  Medications Prior to Admission  Medication Sig Dispense Refill  . Ascorbic Acid (VITAMIN C) 1000 MG tablet Take 1,000 mg by mouth daily.    Marland Kitchen aspirin 81 MG tablet Take 81 mg by mouth daily.    Marland Kitchen atorvastatin (LIPITOR) 10 MG tablet Take 10 mg by mouth daily.    Marland Kitchen lisinopril (PRINIVIL,ZESTRIL) 2.5 MG tablet Take 2.5 mg by mouth daily.    . metFORMIN (GLUCOPHAGE) 500 MG tablet Take 500 mg by mouth daily.  1  . Multiple Vitamins-Minerals (MULTIVITAMIN PO) Take by mouth daily.      Results for orders placed or performed during the hospital encounter of 02/26/15 (from the past 48 hour(s))  Glucose, capillary     Status: Abnormal   Collection Time: 02/26/15  5:46 AM  Result Value Ref Range   Glucose-Capillary 133 (H) 65 - 99 mg/dL   No results  found.  ROS: No bleeding problems. All other systems negative  OBJECTIVE:   Vitals:   Filed Vitals:   02/26/15 0537  BP: 131/88  Pulse: 72  Temp: 97.7 F (36.5 C)  TempSrc: Oral  Resp: 18  Height: 6\' 1"  (1.854 m)  Weight: 227 lb (102.967 kg)  SpO2: 100%   I&O's:  No intake or output data in the 24 hours ending 02/26/15 0716 TELEMETRY: Reviewed telemetry pt in normal sinus rhythm:     PHYSICAL EXAM General: Well developed, well nourished, in no acute distress Head:   Normal cephalic and atramatic  Lungs:  Clear bilaterally to auscultation. Heart:   HRRR S1 S2  No JVD.   Abdomen: abdomen soft and non-tender Msk:  Back normal,  Normal strength and tone for age. Extremities:   No edema.  Scar on right hand Neuro: Alert and oriented. Psych:  Normal affect, responds appropriately  LABS: Basic Metabolic Panel:  Recent Labs  02/23/15 0735  NA 138  K 4.4  CL 104  CO2 26  GLUCOSE 156*  BUN 16  CREATININE 0.97  CALCIUM 9.4   Liver Function Tests: No results for input(s): AST, ALT, ALKPHOS, BILITOT, PROT, ALBUMIN in the last 72 hours. No results for input(s):  LIPASE, AMYLASE in the last 72 hours. CBC:  Recent Labs  02/23/15 0735  WBC 4.3  NEUTROABS 3.1  HGB 13.8  HCT 39.2  MCV 95.4  PLT 115*   Cardiac Enzymes: No results for input(s): CKTOTAL, CKMB, CKMBINDEX, TROPONINI in the last 72 hours. BNP: Invalid input(s): POCBNP D-Dimer: No results for input(s): DDIMER in the last 72 hours. Hemoglobin A1C:  Recent Labs  02/23/15 0735  HGBA1C 6.2*   Fasting Lipid Panel: No results for input(s): CHOL, HDL, LDLCALC, TRIG, CHOLHDL, LDLDIRECT in the last 72 hours. Thyroid Function Tests: No results for input(s): TSH, T4TOTAL, T3FREE, THYROIDAB in the last 72 hours.  Invalid input(s): FREET3 Anemia Panel: No results for input(s): VITAMINB12, FOLATE, FERRITIN, TIBC, IRON, RETICCTPCT in the last 72 hours. Coag Panel:   Lab Results  Component Value Date    INR 1.06 02/23/2015       Assessment/Plan Abnormal stress test/ abnormal CT scan: Plan for cardiac catheterization to evaluate extent of coronary artery disease. The patient has no bleeding problems. No elective surgery coming up. If PCI as required, he should be fine for drug-eluting stent placement. All of the patient's questions were answered.  Teyah Rossy S. 02/26/2015, 7:16 AM

## 2015-02-26 NOTE — Interval H&P Note (Signed)
Cath Lab Visit (complete for each Cath Lab visit)  Clinical Evaluation Leading to the Procedure:   ACS: No.  Non-ACS:    Anginal Classification: CCS II  Anti-ischemic medical therapy: No Therapy  Non-Invasive Test Results: High-risk stress test findings: cardiac mortality >3%/year  Prior CABG: No previous CABG      History and Physical Interval Note:  02/26/2015 7:35 AM  Frank Walsh  has presented today for surgery, with the diagnosis of abnormal calcium score, cp  The various methods of treatment have been discussed with the patient and family. After consideration of risks, benefits and other options for treatment, the patient has consented to  Procedure(s): Left Heart Cath and Coronary Angiography (N/A) as a surgical intervention .  The patient's history has been reviewed, patient examined, no change in status, stable for surgery.  I have reviewed the patient's chart and labs.  Questions were answered to the patient's satisfaction.     Rula Keniston S.

## 2015-03-01 MED FILL — Heparin Sodium (Porcine) 2 Unit/ML in Sodium Chloride 0.9%: INTRAMUSCULAR | Qty: 500 | Status: AC

## 2015-07-27 DIAGNOSIS — I2510 Atherosclerotic heart disease of native coronary artery without angina pectoris: Secondary | ICD-10-CM | POA: Diagnosis not present

## 2015-07-27 DIAGNOSIS — E782 Mixed hyperlipidemia: Secondary | ICD-10-CM | POA: Diagnosis not present

## 2015-07-27 DIAGNOSIS — E559 Vitamin D deficiency, unspecified: Secondary | ICD-10-CM | POA: Diagnosis not present

## 2015-07-27 DIAGNOSIS — Z79899 Other long term (current) drug therapy: Secondary | ICD-10-CM | POA: Diagnosis not present

## 2015-07-27 DIAGNOSIS — Z125 Encounter for screening for malignant neoplasm of prostate: Secondary | ICD-10-CM | POA: Diagnosis not present

## 2015-07-27 DIAGNOSIS — R3129 Other microscopic hematuria: Secondary | ICD-10-CM | POA: Diagnosis not present

## 2015-07-27 DIAGNOSIS — Z23 Encounter for immunization: Secondary | ICD-10-CM | POA: Diagnosis not present

## 2015-07-27 DIAGNOSIS — I10 Essential (primary) hypertension: Secondary | ICD-10-CM | POA: Diagnosis not present

## 2015-07-27 DIAGNOSIS — Z0001 Encounter for general adult medical examination with abnormal findings: Secondary | ICD-10-CM | POA: Diagnosis not present

## 2015-07-27 DIAGNOSIS — E119 Type 2 diabetes mellitus without complications: Secondary | ICD-10-CM | POA: Diagnosis not present

## 2015-07-27 DIAGNOSIS — Z1389 Encounter for screening for other disorder: Secondary | ICD-10-CM | POA: Diagnosis not present

## 2015-07-27 DIAGNOSIS — Z7984 Long term (current) use of oral hypoglycemic drugs: Secondary | ICD-10-CM | POA: Diagnosis not present

## 2015-07-27 DIAGNOSIS — E7800 Pure hypercholesterolemia, unspecified: Secondary | ICD-10-CM | POA: Diagnosis not present

## 2015-08-03 ENCOUNTER — Other Ambulatory Visit (HOSPITAL_BASED_OUTPATIENT_CLINIC_OR_DEPARTMENT_OTHER): Payer: Medicare Other

## 2015-08-03 ENCOUNTER — Ambulatory Visit (HOSPITAL_BASED_OUTPATIENT_CLINIC_OR_DEPARTMENT_OTHER): Payer: Medicare Other | Admitting: Hematology and Oncology

## 2015-08-03 ENCOUNTER — Telehealth: Payer: Self-pay | Admitting: Hematology and Oncology

## 2015-08-03 ENCOUNTER — Encounter: Payer: Self-pay | Admitting: Hematology and Oncology

## 2015-08-03 VITALS — BP 151/74 | HR 55 | Temp 97.9°F | Resp 18 | Ht 73.0 in | Wt 238.7 lb

## 2015-08-03 DIAGNOSIS — Z85828 Personal history of other malignant neoplasm of skin: Secondary | ICD-10-CM | POA: Diagnosis not present

## 2015-08-03 DIAGNOSIS — Z8579 Personal history of other malignant neoplasms of lymphoid, hematopoietic and related tissues: Secondary | ICD-10-CM

## 2015-08-03 DIAGNOSIS — Z8572 Personal history of non-Hodgkin lymphomas: Secondary | ICD-10-CM

## 2015-08-03 DIAGNOSIS — C8510 Unspecified B-cell lymphoma, unspecified site: Secondary | ICD-10-CM

## 2015-08-03 LAB — CBC WITH DIFFERENTIAL/PLATELET
BASO%: 2.2 % — ABNORMAL HIGH (ref 0.0–2.0)
BASOS ABS: 0.1 10*3/uL (ref 0.0–0.1)
EOS ABS: 0.1 10*3/uL (ref 0.0–0.5)
EOS%: 3 % (ref 0.0–7.0)
HEMATOCRIT: 43.3 % (ref 38.4–49.9)
HGB: 14.4 g/dL (ref 13.0–17.1)
LYMPH#: 0.6 10*3/uL — AB (ref 0.9–3.3)
LYMPH%: 14.2 % (ref 14.0–49.0)
MCH: 32.6 pg (ref 27.2–33.4)
MCHC: 33.2 g/dL (ref 32.0–36.0)
MCV: 98.1 fL — AB (ref 79.3–98.0)
MONO#: 0.4 10*3/uL (ref 0.1–0.9)
MONO%: 7.9 % (ref 0.0–14.0)
NEUT%: 72.7 % (ref 39.0–75.0)
NEUTROS ABS: 3.2 10*3/uL (ref 1.5–6.5)
PLATELETS: 151 10*3/uL (ref 140–400)
RBC: 4.42 10*6/uL (ref 4.20–5.82)
RDW: 13.4 % (ref 11.0–14.6)
WBC: 4.5 10*3/uL (ref 4.0–10.3)

## 2015-08-03 LAB — COMPREHENSIVE METABOLIC PANEL
ALBUMIN: 3.7 g/dL (ref 3.5–5.0)
ALK PHOS: 71 U/L (ref 40–150)
ALT: 25 U/L (ref 0–55)
ANION GAP: 8 meq/L (ref 3–11)
AST: 21 U/L (ref 5–34)
BILIRUBIN TOTAL: 0.75 mg/dL (ref 0.20–1.20)
BUN: 13.2 mg/dL (ref 7.0–26.0)
CO2: 25 meq/L (ref 22–29)
CREATININE: 1 mg/dL (ref 0.7–1.3)
Calcium: 8.9 mg/dL (ref 8.4–10.4)
Chloride: 108 mEq/L (ref 98–109)
EGFR: 82 mL/min/{1.73_m2} — AB (ref >90)
Glucose: 180 mg/dl — ABNORMAL HIGH (ref 70–140)
Potassium: 4.7 mEq/L (ref 3.5–5.1)
Sodium: 142 mEq/L (ref 136–145)
TOTAL PROTEIN: 6.3 g/dL — AB (ref 6.4–8.3)

## 2015-08-03 LAB — LACTATE DEHYDROGENASE: LDH: 171 U/L (ref 125–245)

## 2015-08-03 NOTE — Progress Notes (Signed)
[name] OFFICE PROGRESS NOTE  Patient Care Team: Josetta Huddle, MD as PCP - General (Internal Medicine)  SUMMARY OF ONCOLOGIC HISTORY:  Frank Walsh was transferred to my care after his prior physician has left.  I reviewed the patient's records extensive and collaborated the history with the patient. 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 CT scan show disease within the lymph nodes in the pelvis and retroperitoneum. He received 6 cycles of R. CHOP chemotherapy without long-term complications. He also had history of skin cancer and follows with a dermatologist regularly.   INTERVAL HISTORY: Please see below for problem oriented charting. He feels well. No new lymphadenopathy. Denies recent infection.  REVIEW OF SYSTEMS:   Constitutional: Denies fevers, chills or abnormal weight loss Eyes: Denies blurriness of vision Ears, nose, mouth, throat, and face: Denies mucositis or sore throat Respiratory: Denies cough, dyspnea or wheezes Cardiovascular: Denies palpitation, chest discomfort or lower extremity swelling Gastrointestinal:  Denies nausea, heartburn or change in bowel habits Skin: Denies abnormal skin rashes Lymphatics: Denies new lymphadenopathy or easy bruising Neurological:Denies numbness, tingling or new weaknesses Behavioral/Psych: Mood is stable, no new changes  All other systems were reviewed with the patient and are negative.  I have reviewed the past medical history, past surgical history, social history and family history with the patient and they are unchanged from previous note.  ALLERGIES:  has No Known Allergies.  MEDICATIONS:  Current Outpatient Prescriptions  Medication Sig Dispense Refill  . Ascorbic Acid (VITAMIN C) 1000 MG tablet Take 1,000 mg by mouth daily.    Marland Kitchen aspirin 81 MG tablet Take 81 mg by mouth daily.    Marland Kitchen atorvastatin  (LIPITOR) 10 MG tablet Take 10 mg by mouth daily.    Marland Kitchen lisinopril (PRINIVIL,ZESTRIL) 2.5 MG tablet Take 2.5 mg by mouth daily.    . metFORMIN (GLUCOPHAGE) 500 MG tablet Take 1 tablet (500 mg total) by mouth daily.  1  . Multiple Vitamins-Minerals (MULTIVITAMIN PO) Take by mouth daily.     No current facility-administered medications for this visit.     PHYSICAL EXAMINATION: ECOG PERFORMANCE STATUS: 0 - Asymptomatic  Vitals:   08/03/15 0808  BP: (!) 151/74  Pulse: (!) 55  Resp: 18  Temp: 97.9 F (36.6 C)   Filed Weights   08/03/15 0808  Weight: 238 lb 11.2 oz (108.3 kg)    GENERAL:alert, no distress and comfortable. He is obese SKIN: skin color, texture, turgor are normal, no rashes or significant lesions EYES: normal, Conjunctiva are pink and non-injected, sclera clear OROPHARYNX:no exudate, no erythema and lips, buccal mucosa, and tongue normal  NECK: supple, thyroid normal size, non-tender, without nodularity LYMPH:  no palpable lymphadenopathy in the cervical, axillary or inguinal LUNGS: clear to auscultation and percussion with normal breathing effort HEART: regular rate & rhythm and no murmurs and no lower extremity edema ABDOMEN:abdomen soft, non-tender and normal bowel sounds Musculoskeletal:no cyanosis of digits and no clubbing  NEURO: alert & oriented x 3 with fluent speech, no focal motor/sensory deficits  LABORATORY DATA:  I have reviewed the data as listed    Component Value Date/Time   NA 142 08/03/2015 0758   K 4.7 08/03/2015 0758   CL 104 02/23/2015 0735   CL 107 07/01/2012 1153   CO2 25 08/03/2015 0758   GLUCOSE 180 (H) 08/03/2015 0758   GLUCOSE 102 (H) 07/01/2012 1153   BUN 13.2 08/03/2015 0758  CREATININE 1.0 08/03/2015 0758   CALCIUM 8.9 08/03/2015 0758   PROT 6.3 (L) 08/03/2015 0758   ALBUMIN 3.7 08/03/2015 0758   AST 21 08/03/2015 0758   ALT 25 08/03/2015 0758   ALKPHOS 71 08/03/2015 0758   BILITOT 0.75 08/03/2015 0758    No results  found for: SPEP, UPEP  Lab Results  Component Value Date   WBC 4.5 08/03/2015   NEUTROABS 3.2 08/03/2015   HGB 14.4 08/03/2015   HCT 43.3 08/03/2015   MCV 98.1 (H) 08/03/2015   PLT 151 08/03/2015      Chemistry      Component Value Date/Time   NA 142 08/03/2015 0758   K 4.7 08/03/2015 0758   CL 104 02/23/2015 0735   CL 107 07/01/2012 1153   CO2 25 08/03/2015 0758   BUN 13.2 08/03/2015 0758   CREATININE 1.0 08/03/2015 0758      Component Value Date/Time   CALCIUM 8.9 08/03/2015 0758   ALKPHOS 71 08/03/2015 0758   AST 21 08/03/2015 0758   ALT 25 08/03/2015 0758   BILITOT 0.75 08/03/2015 0758      ASSESSMENT & PLAN:  History of lymphoma Clinically, 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 The patient wants to come back here to be examined  even though he is 17 years away from prior treatment and remain in remission. I will transition his care to cancer survivorship clinic   History of skin cancer The patient is avoiding excessive sun exposure. He sees a Paediatric nurse on a yearly basis.      Orders Placed This Encounter  Procedures  . CBC with Differential/Platelet    Standing Status:   Future    Standing Expiration Date:   09/06/2016  . Comprehensive metabolic panel    Standing Status:   Future    Standing Expiration Date:   09/06/2016  . Lactate dehydrogenase (LDH)    Standing Status:   Future    Standing Expiration Date:   08/02/2016  . Amb Referral to Survivorship Long term    Referral Priority:   Routine    Referral Type:   Consultation    Referred to Provider:   Holley Bouche, NP    Number of Visits Requested:   1   All questions were answered. The patient knows to call the clinic with any problems, questions or concerns. No barriers to learning was detected. I spent 10 minutes counseling the patient face to face. The total time spent in the appointment was 15 minutes and more than 50% was on counseling and review of test results     [facility], [place], MD 08/03/2015 9:31  PM

## 2015-08-03 NOTE — Telephone Encounter (Signed)
Gave patient avs report and appointments for July 2018. With Mike Craze per 08/03/15 pof. LTS patient scheduled in SCP slot due to he needs an early morning appointment in order to be back at his office to see patients.

## 2015-08-04 NOTE — Assessment & Plan Note (Signed)
The patient is avoiding excessive sun exposure. He sees a dermatologist on a yearly basis.   

## 2015-08-04 NOTE — Assessment & Plan Note (Signed)
Clinically, 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 The patient wants to come back here to be examined  even though he is 17 years away from prior treatment and remain in remission. I will transition his care to cancer survivorship clinic

## 2015-08-23 ENCOUNTER — Encounter (INDEPENDENT_AMBULATORY_CARE_PROVIDER_SITE_OTHER): Payer: Medicare Other | Admitting: Ophthalmology

## 2015-08-23 DIAGNOSIS — H33303 Unspecified retinal break, bilateral: Secondary | ICD-10-CM | POA: Diagnosis not present

## 2015-08-23 DIAGNOSIS — H2513 Age-related nuclear cataract, bilateral: Secondary | ICD-10-CM | POA: Diagnosis not present

## 2015-08-23 DIAGNOSIS — H43813 Vitreous degeneration, bilateral: Secondary | ICD-10-CM | POA: Diagnosis not present

## 2015-09-09 ENCOUNTER — Ambulatory Visit (INDEPENDENT_AMBULATORY_CARE_PROVIDER_SITE_OTHER): Payer: Medicare Other | Admitting: Ophthalmology

## 2015-09-09 DIAGNOSIS — H33301 Unspecified retinal break, right eye: Secondary | ICD-10-CM

## 2015-11-15 DIAGNOSIS — Z23 Encounter for immunization: Secondary | ICD-10-CM | POA: Diagnosis not present

## 2016-01-19 ENCOUNTER — Ambulatory Visit (INDEPENDENT_AMBULATORY_CARE_PROVIDER_SITE_OTHER): Payer: Medicare Other | Admitting: Ophthalmology

## 2016-01-19 DIAGNOSIS — H33303 Unspecified retinal break, bilateral: Secondary | ICD-10-CM | POA: Diagnosis not present

## 2016-01-19 DIAGNOSIS — H43813 Vitreous degeneration, bilateral: Secondary | ICD-10-CM

## 2016-01-19 DIAGNOSIS — H35371 Puckering of macula, right eye: Secondary | ICD-10-CM | POA: Diagnosis not present

## 2016-01-19 DIAGNOSIS — H2513 Age-related nuclear cataract, bilateral: Secondary | ICD-10-CM

## 2016-02-02 DIAGNOSIS — E119 Type 2 diabetes mellitus without complications: Secondary | ICD-10-CM | POA: Diagnosis not present

## 2016-02-02 DIAGNOSIS — E782 Mixed hyperlipidemia: Secondary | ICD-10-CM | POA: Diagnosis not present

## 2016-07-18 ENCOUNTER — Ambulatory Visit (INDEPENDENT_AMBULATORY_CARE_PROVIDER_SITE_OTHER): Payer: Medicare Other | Admitting: Ophthalmology

## 2016-07-18 DIAGNOSIS — H33303 Unspecified retinal break, bilateral: Secondary | ICD-10-CM | POA: Diagnosis not present

## 2016-07-18 DIAGNOSIS — H43813 Vitreous degeneration, bilateral: Secondary | ICD-10-CM | POA: Diagnosis not present

## 2016-07-18 DIAGNOSIS — H35373 Puckering of macula, bilateral: Secondary | ICD-10-CM | POA: Diagnosis not present

## 2016-07-24 ENCOUNTER — Telehealth: Payer: Self-pay | Admitting: Adult Health

## 2016-07-24 ENCOUNTER — Encounter: Payer: Medicare Other | Admitting: Adult Health

## 2016-07-24 ENCOUNTER — Other Ambulatory Visit: Payer: Medicare Other

## 2016-07-24 NOTE — Telephone Encounter (Signed)
Patient called and wanted to check his appointment because he came in for his appointment and was told that it was tomorrow 7/17 and the patient was unaware of the change.  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

## 2016-07-25 ENCOUNTER — Encounter: Payer: Medicare Other | Admitting: Adult Health

## 2016-07-25 ENCOUNTER — Other Ambulatory Visit: Payer: Medicare Other

## 2016-08-08 DIAGNOSIS — I10 Essential (primary) hypertension: Secondary | ICD-10-CM | POA: Diagnosis not present

## 2016-08-08 DIAGNOSIS — I2510 Atherosclerotic heart disease of native coronary artery without angina pectoris: Secondary | ICD-10-CM | POA: Diagnosis not present

## 2016-08-08 DIAGNOSIS — Z79899 Other long term (current) drug therapy: Secondary | ICD-10-CM | POA: Diagnosis not present

## 2016-08-08 DIAGNOSIS — M720 Palmar fascial fibromatosis [Dupuytren]: Secondary | ICD-10-CM | POA: Diagnosis not present

## 2016-08-08 DIAGNOSIS — Z125 Encounter for screening for malignant neoplasm of prostate: Secondary | ICD-10-CM | POA: Diagnosis not present

## 2016-08-08 DIAGNOSIS — Z6831 Body mass index (BMI) 31.0-31.9, adult: Secondary | ICD-10-CM | POA: Diagnosis not present

## 2016-08-08 DIAGNOSIS — E782 Mixed hyperlipidemia: Secondary | ICD-10-CM | POA: Diagnosis not present

## 2016-08-08 DIAGNOSIS — E669 Obesity, unspecified: Secondary | ICD-10-CM | POA: Diagnosis not present

## 2016-08-08 DIAGNOSIS — Z0001 Encounter for general adult medical examination with abnormal findings: Secondary | ICD-10-CM | POA: Diagnosis not present

## 2016-08-08 DIAGNOSIS — Z0000 Encounter for general adult medical examination without abnormal findings: Secondary | ICD-10-CM | POA: Diagnosis not present

## 2016-08-08 DIAGNOSIS — R3129 Other microscopic hematuria: Secondary | ICD-10-CM | POA: Diagnosis not present

## 2016-08-08 DIAGNOSIS — E119 Type 2 diabetes mellitus without complications: Secondary | ICD-10-CM | POA: Diagnosis not present

## 2016-08-08 DIAGNOSIS — Z8572 Personal history of non-Hodgkin lymphomas: Secondary | ICD-10-CM | POA: Diagnosis not present

## 2016-08-08 DIAGNOSIS — K5730 Diverticulosis of large intestine without perforation or abscess without bleeding: Secondary | ICD-10-CM | POA: Diagnosis not present

## 2016-08-08 DIAGNOSIS — E559 Vitamin D deficiency, unspecified: Secondary | ICD-10-CM | POA: Diagnosis not present

## 2016-08-08 DIAGNOSIS — Z1389 Encounter for screening for other disorder: Secondary | ICD-10-CM | POA: Diagnosis not present

## 2016-08-28 DIAGNOSIS — Z1211 Encounter for screening for malignant neoplasm of colon: Secondary | ICD-10-CM | POA: Diagnosis not present

## 2016-08-28 DIAGNOSIS — Z1212 Encounter for screening for malignant neoplasm of rectum: Secondary | ICD-10-CM | POA: Diagnosis not present

## 2016-09-04 ENCOUNTER — Other Ambulatory Visit: Payer: Self-pay | Admitting: Adult Health

## 2016-09-04 DIAGNOSIS — Z8579 Personal history of other malignant neoplasms of lymphoid, hematopoietic and related tissues: Secondary | ICD-10-CM

## 2016-09-04 NOTE — Progress Notes (Signed)
CLINIC:  Survivorship   REASON FOR VISIT:  Routine follow-up for history of lymphoma.   BRIEF ONCOLOGIC HISTORY:   Per Dr. Alvy Bimler note from 07/2015 This patient was diagnosed with follicular lymphoma after presentation with palpable lymphadenopathy. CT scan show disease within the lymph nodes in the pelvis and retroperitoneum. He received 6 cycles of CHOP chemotherapy followed by four cycles of Rituximab consolidation without long-term complications.  He finished treatment about 18 years ago.   He also had history of skin cancer and follows with a dermatologist regularly.   INTERVAL HISTORY:  Frank Walsh presents to the Survivorship Clinic today for routine follow-up for his history of lymphoma.  He is doing well today.  He denies any new issues today, and is working on his diet.  He does still struggle with exercise.      REVIEW OF SYSTEMS:  Review of Systems  Constitutional: Negative for appetite change, chills, fatigue, fever and unexpected weight change.  HENT:   Negative for hearing loss and lump/mass.   Eyes: Negative for eye problems and icterus.  Respiratory: Negative for chest tightness, cough and shortness of breath.   Cardiovascular: Negative for chest pain, leg swelling and palpitations.  Gastrointestinal: Negative for abdominal distention, abdominal pain, constipation, diarrhea, nausea and vomiting.  Endocrine: Negative for hot flashes.  Musculoskeletal: Negative for arthralgias.  Skin: Negative for itching and rash.  Neurological: Negative for dizziness, extremity weakness, headaches and numbness.  Hematological: Negative for adenopathy. Does not bruise/bleed easily.  Psychiatric/Behavioral: Negative for depression. The patient is not nervous/anxious.        PAST MEDICAL/SURGICAL HISTORY:  Past Medical History:  Diagnosis Date  . Herpes zoster infection of thoracic region    March, 2012  . Hyperlipidemia, mixed   . Leukopenia   . Low grade B-cell lymphoma  (HCC)    [place]  IPI low risk  Follicular grade 2, B cell  NHL dx Jan, 2001 right inguinal adenopathy  . Skin cancer    basal cell and squamous cell  . Thrombocytopenia, unspecified ([place])    Past Surgical History:  Procedure Laterality Date  . CARDIAC CATHETERIZATION N/A 02/26/2015   Procedure: Left Heart Cath and Coronary Angiography;  Surgeon: Jettie Booze, MD;  Location: [facility];  Service: Cardiovascular;  Laterality: N/A;  . DUPUYTREN / PALMAR FASCIOTOMY    . LN biopsy       ALLERGIES:  No Known Allergies   CURRENT MEDICATIONS:  Outpatient Encounter Prescriptions as of 09/05/2016  Medication Sig  . aspirin 81 MG tablet Take 81 mg by mouth daily.  Marland Kitchen atorvastatin (LIPITOR) 10 MG tablet Take 10 mg by mouth daily.  Marland Kitchen lisinopril (PRINIVIL,ZESTRIL) 2.5 MG tablet Take 2.5 mg by mouth daily.  . metFORMIN (GLUCOPHAGE) 500 MG tablet Take 1 tablet (500 mg total) by mouth daily.  . Multiple Vitamins-Minerals (MULTIVITAMIN PO) Take by mouth daily.  . Ascorbic Acid (VITAMIN C) 1000 MG tablet Take 1,000 mg by mouth daily.   No facility-administered encounter medications on file as of 09/05/2016.      ONCOLOGIC FAMILY HISTORY:  Family History  Problem Relation Age of Onset  . Alzheimer's disease Father   . CAD Brother        Frank Walsh      SOCIAL HISTORY:  Frank Walsh is single and lives alone in [place], [place].  Frank Walsh does not have any children.  He is currently working full time as a Pharmacist, community.  Denies any current or history of tobacco, alcohol, or illicit drug use.    PHYSICAL EXAMINATION:  Vital Signs: Vitals:   09/05/16 0846  BP: (!) 145/70  Pulse: 65  Resp: 20  Temp: 98.2 F (36.8 C)  SpO2: 99%   Filed Weights   09/05/16 0846  Weight: 238 lb 4.8 oz (108.1 kg)   General: Well-nourished, well-appearing male in no acute distress. Unaccompanied today. HEENT: Head is normocephalic.  Pupils equal and reactive to light.  Conjunctivae clear without exudate.  Sclerae anicteric. Oral mucosa is pink, moist.  Oropharynx is pink without lesions or erythema.  Lymph: No cervical, supraclavicular, infraclavicular, or axillary lymphadenopathy noted on palpation.  Cardiovascular: Regular rate and rhythm.Marland Kitchen Respiratory: Clear to auscultation bilaterally. Chest expansion symmetric; breathing non-labored.  GI: Abdomen soft and round; non-tender, non-distended. Bowel sounds normoactive. No hepatosplenomegaly.   GU: Deferred.  Neuro: No focal deficits. Steady gait.  Psych: Mood and affect normal and appropriate for situation.  Extremities: No edema. Skin: Warm and dry.   LABORATORY DATA:  None for this visit  DIAGNOSTIC IMAGING:  None for this visit     ASSESSMENT AND PLAN:  Frank Walsh is a pleasant 66 y.o. male with history of follicular lymphoma , diagnosed in 01/1999; treated with CHOP x 6 followed by 4 doses of Rituxan consolidation. Frank Walsh presents to the Survivorship Clinic for surveillance and routine follow-up.   1. History of lymphoma:  Frank Walsh is currently clinically without evidence of disease or recurrence of lymphoma. He will follow-up in the Survivorship Clinic in 1 year with labs, history, and physical exam per surveillance protocol.  I encouraged him to call me with any questions or concerns before his next visit at the cancer center, and I would be happy to see the patient sooner, if needed.    2. Cancer screening:  Due to Frank Walsh's history and age, he should receive screening for skin cancers, colon cancer, and prostate cancers. The patient was encouraged to follow-up with his PCP for appropriate cancer screenings.   3. Health maintenance and wellness promotion: Frank Walsh was encouraged to consume 5-7 servings of fruits and vegetables per day. The patient was also encouraged to engage in moderate to vigorous exercise for 30 minutes per day most days of the week. Frank Walsh was instructed to  limit her alcohol consumption and continue to abstain from tobacco use.  Dispo:  -Return to cancer center to see Survivorship Frank Walsh in one year   A total of (30) minutes of face-to-face time was spent with this patient with greater than 50% of that time in counseling and care-coordination.   Frank Phlegm, [facility] [place] [phone]   Note: PRIMARY CARE PROVIDER Josetta Huddle, [place] [phone]

## 2016-09-05 ENCOUNTER — Encounter: Payer: Self-pay | Admitting: Adult Health

## 2016-09-05 ENCOUNTER — Other Ambulatory Visit (HOSPITAL_BASED_OUTPATIENT_CLINIC_OR_DEPARTMENT_OTHER): Payer: Medicare Other

## 2016-09-05 ENCOUNTER — Ambulatory Visit (HOSPITAL_BASED_OUTPATIENT_CLINIC_OR_DEPARTMENT_OTHER): Payer: Medicare Other | Admitting: Adult Health

## 2016-09-05 VITALS — BP 145/70 | HR 65 | Temp 98.2°F | Resp 20 | Ht 73.0 in | Wt 238.3 lb

## 2016-09-05 DIAGNOSIS — Z8572 Personal history of non-Hodgkin lymphomas: Secondary | ICD-10-CM | POA: Diagnosis not present

## 2016-09-05 DIAGNOSIS — C8510 Unspecified B-cell lymphoma, unspecified site: Secondary | ICD-10-CM

## 2016-09-05 DIAGNOSIS — Z8579 Personal history of other malignant neoplasms of lymphoid, hematopoietic and related tissues: Secondary | ICD-10-CM

## 2016-09-05 LAB — CBC WITH DIFFERENTIAL/PLATELET
BASO%: 0.6 % (ref 0.0–2.0)
Basophils Absolute: 0 10e3/uL (ref 0.0–0.1)
EOS%: 5 % (ref 0.0–7.0)
Eosinophils Absolute: 0.2 10e3/uL (ref 0.0–0.5)
HCT: 42.6 % (ref 38.4–49.9)
HGB: 14.3 g/dL (ref 13.0–17.1)
LYMPH%: 19.9 % (ref 14.0–49.0)
MCH: 33.5 pg — ABNORMAL HIGH (ref 27.2–33.4)
MCHC: 33.7 g/dL (ref 32.0–36.0)
MCV: 99.4 fL — ABNORMAL HIGH (ref 79.3–98.0)
MONO#: 0.3 10e3/uL (ref 0.1–0.9)
MONO%: 8.3 % (ref 0.0–14.0)
NEUT#: 2.8 10e3/uL (ref 1.5–6.5)
NEUT%: 66.2 % (ref 39.0–75.0)
Platelets: 168 10e3/uL (ref 140–400)
RBC: 4.28 10e6/uL (ref 4.20–5.82)
RDW: 13.5 % (ref 11.0–14.6)
WBC: 4.2 10e3/uL (ref 4.0–10.3)
lymph#: 0.8 10e3/uL — ABNORMAL LOW (ref 0.9–3.3)

## 2016-09-05 LAB — COMPREHENSIVE METABOLIC PANEL
ALT: 29 U/L (ref 0–55)
ANION GAP: 7 meq/L (ref 3–11)
AST: 21 U/L (ref 5–34)
Albumin: 3.6 g/dL (ref 3.5–5.0)
Alkaline Phosphatase: 65 U/L (ref 40–150)
BILIRUBIN TOTAL: 0.67 mg/dL (ref 0.20–1.20)
BUN: 11.8 mg/dL (ref 7.0–26.0)
CALCIUM: 9.3 mg/dL (ref 8.4–10.4)
CO2: 24 meq/L (ref 22–29)
CREATININE: 0.9 mg/dL (ref 0.7–1.3)
Chloride: 106 mEq/L (ref 98–109)
EGFR: 86 mL/min/{1.73_m2} — ABNORMAL LOW (ref >90)
Glucose: 290 mg/dl — ABNORMAL HIGH (ref 70–140)
Potassium: 4.4 mEq/L (ref 3.5–5.1)
Sodium: 136 mEq/L (ref 136–145)
TOTAL PROTEIN: 6.4 g/dL (ref 6.4–8.3)

## 2016-09-05 LAB — TECHNOLOGIST REVIEW

## 2016-09-05 LAB — LACTATE DEHYDROGENASE: LDH: 175 U/L (ref 125–245)

## 2016-09-08 ENCOUNTER — Telehealth: Payer: Self-pay

## 2016-09-08 NOTE — Telephone Encounter (Signed)
Left message on VM to call center back (have lab results).

## 2016-10-18 DIAGNOSIS — Z23 Encounter for immunization: Secondary | ICD-10-CM | POA: Diagnosis not present

## 2017-02-02 ENCOUNTER — Other Ambulatory Visit: Payer: Self-pay

## 2017-02-02 ENCOUNTER — Encounter (HOSPITAL_COMMUNITY): Payer: Self-pay | Admitting: Emergency Medicine

## 2017-02-02 ENCOUNTER — Emergency Department (HOSPITAL_COMMUNITY)
Admission: EM | Admit: 2017-02-02 | Discharge: 2017-02-03 | Disposition: A | Discharge status: Home / Self Care | Payer: Medicare Other | Attending: Emergency Medicine | Admitting: Emergency Medicine

## 2017-02-02 DIAGNOSIS — N200 Calculus of kidney: Secondary | ICD-10-CM | POA: Insufficient documentation

## 2017-02-02 DIAGNOSIS — Z85828 Personal history of other malignant neoplasm of skin: Secondary | ICD-10-CM | POA: Diagnosis not present

## 2017-02-02 DIAGNOSIS — R112 Nausea with vomiting, unspecified: Secondary | ICD-10-CM | POA: Insufficient documentation

## 2017-02-02 DIAGNOSIS — Z7984 Long term (current) use of oral hypoglycemic drugs: Secondary | ICD-10-CM | POA: Diagnosis not present

## 2017-02-02 DIAGNOSIS — Z7982 Long term (current) use of aspirin: Secondary | ICD-10-CM | POA: Diagnosis not present

## 2017-02-02 DIAGNOSIS — Z8572 Personal history of non-Hodgkin lymphomas: Secondary | ICD-10-CM | POA: Diagnosis not present

## 2017-02-02 DIAGNOSIS — N202 Calculus of kidney with calculus of ureter: Secondary | ICD-10-CM | POA: Diagnosis not present

## 2017-02-02 DIAGNOSIS — Z79899 Other long term (current) drug therapy: Secondary | ICD-10-CM | POA: Diagnosis not present

## 2017-02-02 DIAGNOSIS — R109 Unspecified abdominal pain: Secondary | ICD-10-CM | POA: Diagnosis not present

## 2017-02-02 MED ORDER — FENTANYL CITRATE (PF) 100 MCG/2ML IJ SOLN
50.0000 ug | INTRAMUSCULAR | Status: AC | PRN
  Administered 2017-02-02 (×2): 50 ug via NASAL
  Filled 2017-02-02 (×2): qty 2

## 2017-02-02 NOTE — ED Notes (Signed)
MD Palumbo made aware of pt.

## 2017-02-02 NOTE — ED Triage Notes (Addendum)
Patient is complaining of right flank and right lower abdominal pain going into right groin. Patient is having vomiting. Patient states it started an hour ago.

## 2017-02-03 ENCOUNTER — Emergency Department (HOSPITAL_COMMUNITY): Payer: Medicare Other

## 2017-02-03 ENCOUNTER — Encounter (HOSPITAL_COMMUNITY): Payer: Self-pay | Admitting: Emergency Medicine

## 2017-02-03 DIAGNOSIS — N200 Calculus of kidney: Secondary | ICD-10-CM | POA: Diagnosis not present

## 2017-02-03 DIAGNOSIS — N202 Calculus of kidney with calculus of ureter: Secondary | ICD-10-CM | POA: Diagnosis not present

## 2017-02-03 LAB — URINALYSIS, ROUTINE W REFLEX MICROSCOPIC
BACTERIA UA: NONE SEEN
BILIRUBIN URINE: NEGATIVE
Glucose, UA: >=500 mg/dL — AB
KETONES UR: 20 mg/dL — AB
LEUKOCYTES UA: NEGATIVE
NITRITE: NEGATIVE
PROTEIN: NEGATIVE mg/dL
SPECIFIC GRAVITY, URINE: 1.03 (ref 1.005–1.030)
pH: 5 (ref 5.0–8.0)

## 2017-02-03 LAB — CBC
HEMATOCRIT: 42.4 % (ref 39.0–52.0)
HEMOGLOBIN: 14.1 g/dL (ref 13.0–17.0)
MCH: 32.5 pg (ref 26.0–34.0)
MCHC: 33.3 g/dL (ref 30.0–36.0)
MCV: 97.7 fL (ref 78.0–100.0)
Platelets: 208 10*3/uL (ref 150–400)
RBC: 4.34 MIL/uL (ref 4.22–5.81)
RDW: 13.1 % (ref 11.5–15.5)
WBC: 10.6 10*3/uL — AB (ref 4.0–10.5)

## 2017-02-03 LAB — COMPREHENSIVE METABOLIC PANEL
ALBUMIN: 3.9 g/dL (ref 3.5–5.0)
ALT: 46 U/L (ref 17–63)
ANION GAP: 10 (ref 5–15)
AST: 50 U/L — AB (ref 15–41)
Alkaline Phosphatase: 66 U/L (ref 38–126)
BUN: 16 mg/dL (ref 6–20)
CHLORIDE: 104 mmol/L (ref 101–111)
CO2: 21 mmol/L — ABNORMAL LOW (ref 22–32)
Calcium: 8.6 mg/dL — ABNORMAL LOW (ref 8.9–10.3)
Creatinine, Ser: 1 mg/dL (ref 0.61–1.24)
GFR calc Af Amer: >60 mL/min (ref >60)
Glucose, Bld: 318 mg/dL — ABNORMAL HIGH (ref 65–99)
POTASSIUM: 4.5 mmol/L (ref 3.5–5.1)
Sodium: 135 mmol/L (ref 135–145)
TOTAL PROTEIN: 6.6 g/dL (ref 6.5–8.1)
Total Bilirubin: 0.9 mg/dL (ref 0.3–1.2)

## 2017-02-03 MED ORDER — SODIUM CHLORIDE 0.9 % IV BOLUS (SEPSIS)
1000.0000 mL | Freq: Once | INTRAVENOUS | Status: AC
  Administered 2017-02-03: 1000 mL via INTRAVENOUS

## 2017-02-03 MED ORDER — TAMSULOSIN HCL 0.4 MG PO CAPS: 0.4000 mg | ORAL_CAPSULE | Freq: Every day | ORAL | 0 refills | Status: AC

## 2017-02-03 MED ORDER — TAMSULOSIN HCL 0.4 MG PO CAPS
0.8000 mg | ORAL_CAPSULE | Freq: Every day | ORAL | Status: DC
  Administered 2017-02-03: 0.8 mg via ORAL
  Filled 2017-02-03: qty 2

## 2017-02-03 MED ORDER — KETOROLAC TROMETHAMINE 30 MG/ML IJ SOLN
30.0000 mg | Freq: Once | INTRAMUSCULAR | Status: AC
  Administered 2017-02-03: 30 mg via INTRAVENOUS
  Filled 2017-02-03: qty 1

## 2017-02-03 MED ORDER — ONDANSETRON HCL 4 MG/2ML IJ SOLN
4.0000 mg | Freq: Once | INTRAMUSCULAR | Status: AC
  Administered 2017-02-03: 4 mg via INTRAVENOUS
  Filled 2017-02-03: qty 2

## 2017-02-03 MED ORDER — ONDANSETRON 8 MG PO TBDP: ORAL_TABLET | ORAL | 0 refills | Status: AC

## 2017-02-03 MED ORDER — OXYCODONE-ACETAMINOPHEN 5-325 MG PO TABS: 1.0000 | ORAL_TABLET | Freq: Four times a day (QID) | ORAL | 0 refills | Status: DC | PRN

## 2017-02-03 MED ORDER — DICLOFENAC SODIUM ER 100 MG PO TB24: 100.0000 mg | ORAL_TABLET | Freq: Every day | ORAL | 0 refills | Status: DC

## 2017-02-03 NOTE — ED Provider Notes (Signed)
[facility] DEPT Provider Note   CSN: [id number] Arrival date & time: 02/02/17  2241     History   Chief Complaint Chief Complaint  Patient presents with  . Abdominal Pain    HPI Frank Walsh is a 67 y.o. male.  The history is provided by the patient.  Flank Pain  This is a new problem. The current episode started 3 to 5 hours ago. The problem occurs constantly. The problem has not changed since onset.Pertinent negatives include no chest pain, no abdominal pain, no headaches and no shortness of breath. Nothing aggravates the symptoms. Nothing relieves the symptoms. He has tried nothing for the symptoms. The treatment provided no relief.  with nausea and vomiting.    Past Medical History:  Diagnosis Date  . Herpes zoster infection of thoracic region    March, 2012  . Hyperlipidemia, mixed   . Leukopenia   . Low grade B-cell lymphoma (HCC)    [place]  IPI low risk  Follicular grade 2, B cell  NHL dx Jan, 2001 right inguinal adenopathy  . Skin cancer    basal cell and squamous cell  . Thrombocytopenia, unspecified [facility])     Patient Active Problem List   Diagnosis Date Noted  . Abnormal stress test   . Hyperglycemia 08/04/2014  . History of skin cancer 07/29/2013  . Leukopenia 07/01/2012  . Thrombocytopenia, unspecified ([place]) 07/01/2012  . History of lymphoma 06/19/2011  . Herpes zoster infection of thoracic region 06/19/2011  . Hyperlipidemia, mixed 06/19/2011    Past Surgical History:  Procedure Laterality Date  . CARDIAC CATHETERIZATION N/A 02/26/2015   Procedure: Left Heart Cath and Coronary Angiography;  Surgeon: Jettie Booze, MD;  Location: [facility];  Service: Cardiovascular;  Laterality: N/A;  . DUPUYTREN / PALMAR FASCIOTOMY    . LN biopsy         Home Medications    Prior to Admission medications   Medication Sig Start Date End Date Taking? Authorizing Provider  Ascorbic Acid (VITAMIN C) 1000 MG tablet  Take 1,000 mg by mouth daily.    [provider]  aspirin 81 MG tablet Take 81 mg by mouth daily.    [provider]  atorvastatin (LIPITOR) 10 MG tablet Take 10 mg by mouth daily. 06/19/11   [provider]  Diclofenac Sodium CR (VOLTAREN-XR) 100 MG 24 hr tablet Take 1 tablet (100 mg total) by mouth daily. 02/03/17   Sohum Delillo, MD  lisinopril (PRINIVIL,ZESTRIL) 2.5 MG tablet Take 2.5 mg by mouth daily.    [provider]  metFORMIN (GLUCOPHAGE) 500 MG tablet Take 1 tablet (500 mg total) by mouth daily. 02/28/15   Jettie Booze, MD  Multiple Vitamins-Minerals (MULTIVITAMIN PO) Take by mouth daily.    [provider]  ondansetron (ZOFRAN ODT) 8 MG disintegrating tablet 8mg  ODT q8 hours prn nausea 02/03/17   Mavrick Mcquigg, MD  oxyCODONE-acetaminophen (PERCOCET) 5-325 MG tablet Take 1 tablet by mouth every 6 (six) hours as needed. 02/03/17   Jadaya Sommerfield, MD  tamsulosin (FLOMAX) 0.4 MG CAPS capsule Take 1 capsule (0.4 mg total) by mouth daily. 02/03/17   Taren Dymek, MD    Family History Family History  Problem Relation Age of Onset  . Alzheimer's disease Father   . CAD Brother        LIMA to the LAD    Social History Social History   Tobacco Use  . Smoking status: Never Smoker  .  Smokeless tobacco: Never Used  Substance Use Topics  . Alcohol use: No    Comment: rare  . Drug use: No     Allergies   Patient has no known allergies.   Review of Systems Review of Systems  Respiratory: Negative for shortness of breath.   Cardiovascular: Negative for chest pain.  Gastrointestinal: Positive for nausea and vomiting. Negative for abdominal pain.  Genitourinary: Positive for flank pain. Negative for dysuria.  Neurological: Negative for headaches.  All other systems reviewed and are negative.    Physical Exam Updated Vital Signs BP 138/71   Pulse 73   Temp 98.2 F (36.8 C) (Oral)   Resp 18   Ht 6\' 1"  (1.854 m)   Wt  109.8 kg (242 lb)   SpO2 96%   BMI 31.93 kg/m   Physical Exam  Constitutional: He is oriented to person, place, and time. He appears well-developed and well-nourished. No distress.  HENT:  Head: Normocephalic and atraumatic.  Mouth/Throat: No oropharyngeal exudate.  Eyes: Conjunctivae are normal. Pupils are equal, round, and reactive to light.  Neck: Normal range of motion. Neck supple.  Cardiovascular: Normal rate, regular rhythm, normal heart sounds and intact distal pulses.  Pulmonary/Chest: Effort normal and breath sounds normal. No stridor. He has no wheezes. He has no rales.  Abdominal: Soft. Bowel sounds are normal. He exhibits no mass. There is no tenderness. There is no rebound and no guarding.  Musculoskeletal: Normal range of motion.  Neurological: He is alert and oriented to person, place, and time. He displays normal reflexes.  Skin: Skin is warm and dry. Capillary refill takes less than 2 seconds.     ED Treatments / Results  Labs (all labs ordered are listed, but only abnormal results are displayed) Results for orders placed or performed during the hospital encounter of 02/02/17  Comprehensive metabolic panel  Result Value Ref Range   Sodium 135 135 - 145 mmol/L   Potassium 4.5 3.5 - 5.1 mmol/L   Chloride 104 101 - 111 mmol/L   CO2 21 (L) 22 - 32 mmol/L   Glucose, Bld 318 (H) 65 - 99 mg/dL   BUN 16 6 - 20 mg/dL   Creatinine, Ser 1.00 0.61 - 1.24 mg/dL   Calcium 8.6 (L) 8.9 - 10.3 mg/dL   Total Protein 6.6 6.5 - 8.1 g/dL   Albumin 3.9 3.5 - 5.0 g/dL   AST 50 (H) 15 - 41 U/L   ALT 46 17 - 63 U/L   Alkaline Phosphatase 66 38 - 126 U/L   Total Bilirubin 0.9 0.3 - 1.2 mg/dL   GFR calc non Af Amer >60 >60 mL/min   GFR calc Af Amer >60 >60 mL/min   Anion gap 10 5 - 15  CBC  Result Value Ref Range   WBC 10.6 (H) 4.0 - 10.5 K/uL   RBC 4.34 4.22 - 5.81 MIL/uL   Hemoglobin 14.1 13.0 - 17.0 g/dL   HCT 42.4 39.0 - 52.0 %   MCV 97.7 78.0 - 100.0 fL   MCH 32.5 26.0  - 34.0 pg   MCHC 33.3 30.0 - 36.0 g/dL   RDW 13.1 11.5 - 15.5 %   Platelets 208 150 - 400 K/uL  Urinalysis, Routine w reflex microscopic  Result Value Ref Range   Color, Urine YELLOW YELLOW   APPearance CLEAR CLEAR   Specific Gravity, Urine 1.030 1.005 - 1.030   pH 5.0 5.0 - 8.0   Glucose, UA >=500 (A) NEGATIVE mg/dL  Hgb urine dipstick MODERATE (A) NEGATIVE   Bilirubin Urine NEGATIVE NEGATIVE   Ketones, ur 20 (A) NEGATIVE mg/dL   Protein, ur NEGATIVE NEGATIVE mg/dL   Nitrite NEGATIVE NEGATIVE   Leukocytes, UA NEGATIVE NEGATIVE   RBC / HPF TOO NUMEROUS TO COUNT 0 - 5 RBC/hpf   WBC, UA 0-5 0 - 5 WBC/hpf   Bacteria, UA NONE SEEN NONE SEEN   Squamous Epithelial / LPF 0-5 (A) NONE SEEN   Mucus PRESENT    Ct Renal Stone Study  Result Date: 02/03/2017 CLINICAL DATA:  Initial evaluation for acute right flank pain. EXAM: CT ABDOMEN AND PELVIS WITHOUT CONTRAST TECHNIQUE: Multidetector CT imaging of the abdomen and pelvis was performed following the standard protocol without IV contrast. COMPARISON:  Prior CT from 03/02/2009. FINDINGS: Lower chest: Mild bibasilar atelectatic changes present within the lung bases. Visualized lungs are otherwise clear. Hepatobiliary: Hepatic steatosis. Vague hypodensity within the central aspect the liver may reflect a small cyst or possibly hemangioma. Liver otherwise unremarkable. Gallbladder within normal limits. No biliary dilatation. Pancreas: Pancreas normal. Spleen: Scattered calcified granulomas noted within the spleen. Spleen otherwise unremarkable. Adrenals/Urinary Tract: Adrenal glands are normal. There is an obstructive 5 mm stone lodged within the proximal right ureter with secondary mild to moderate right hydroureteronephrosis. No other stones seen distally along the course of the right ureter. Additional 4 mm nonobstructive right renal calculus noted. 8 mm nonobstructive left renal calculus. No left-sided hydronephrosis or hydroureter. Bladder  decompressed without acute abnormality. No layering stones within the bladder lumen. Stomach/Bowel: Stomach normal. No evidence for bowel obstruction. Colonic diverticulosis without evidence for acute diverticulitis. No acute inflammatory changes about the bowels. Vascular/Lymphatic: Moderate aorto bi-iliac atherosclerotic disease. 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 No adenopathy. Reproductive: Prostate normal. Other: No free air or fluid. Small fat containing paraumbilical hernia noted. Musculoskeletal: No acute osseous abnormality. No worrisome lytic or blastic osseous lesions. Mild scoliosis with multilevel degenerative spondylolysis noted within the visualized spine. IMPRESSION: 1. 5 mm obstructive stone within the proximal right ureter with secondary mild to moderate right hydroureteronephrosis. 2. Additional bilateral nonobstructive nephrolithiasis as above. 3. Colonic diverticulosis without evidence for acute diverticulitis. Electronically Signed   By: Jeannine Boga M.D.   On: 02/03/2017 01:17    Radiology Ct Renal Stone Study  Result Date: 02/03/2017 CLINICAL DATA:  Initial evaluation for acute right flank pain. EXAM: CT ABDOMEN AND PELVIS WITHOUT CONTRAST TECHNIQUE: Multidetector CT imaging of the abdomen and pelvis was performed following the standard protocol without IV contrast. COMPARISON:  Prior CT from 03/02/2009. FINDINGS: Lower chest: Mild bibasilar atelectatic changes present within the lung bases. Visualized lungs are otherwise clear. Hepatobiliary: Hepatic steatosis. Vague hypodensity within the central aspect the liver may reflect a small cyst or possibly hemangioma. Liver otherwise unremarkable. Gallbladder within normal limits. No biliary dilatation. Pancreas: Pancreas normal. Spleen: Scattered calcified granulomas noted within the spleen. Spleen otherwise unremarkable. Adrenals/Urinary Tract: Adrenal glands are normal. There is an obstructive  5 mm stone lodged within the proximal right ureter with secondary mild to moderate right hydroureteronephrosis. No other stones seen distally along the course of the right ureter. Additional 4 mm nonobstructive right renal calculus noted. 8 mm nonobstructive left renal calculus. No left-sided hydronephrosis or hydroureter. Bladder decompressed without acute abnormality. No layering stones within the bladder lumen. Stomach/Bowel: Stomach normal. No evidence for bowel obstruction. Colonic diverticulosis without evidence for acute diverticulitis. No acute inflammatory changes about the bowels. Vascular/Lymphatic: Moderate aorto bi-iliac atherosclerotic  disease. 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 No adenopathy. Reproductive: Prostate normal. Other: No free air or fluid. Small fat containing paraumbilical hernia noted. Musculoskeletal: No acute osseous abnormality. No worrisome lytic or blastic osseous lesions. Mild scoliosis with multilevel degenerative spondylolysis noted within the visualized spine. IMPRESSION: 1. 5 mm obstructive stone within the proximal right ureter with secondary mild to moderate right hydroureteronephrosis. 2. Additional bilateral nonobstructive nephrolithiasis as above. 3. Colonic diverticulosis without evidence for acute diverticulitis. Electronically Signed   By: Jeannine Boga M.D.   On: 02/03/2017 01:17    Procedures Procedures (including critical care time)  Medications Ordered in ED Medications  tamsulosin (FLOMAX) capsule 0.8 mg (0.8 mg Oral Given 02/03/17 0048)  fentaNYL (SUBLIMAZE) injection 50 mcg (50 mcg Nasal Given 02/02/17 2355)  ketorolac (TORADOL) 30 MG/ML injection 30 mg (30 mg Intravenous Given 02/03/17 0048)  sodium chloride 0.9 % bolus 1,000 mL (0 mLs Intravenous Stopped 02/03/17 0327)  ondansetron (ZOFRAN) injection 4 mg (4 mg Intravenous Given 02/03/17 0048)      Final Clinical Impressions(s) / ED Diagnoses   Final  diagnoses:  Kidney stone  Strain all urine, take medications as directed.  Follow up with urology.    Return for weakness, numbness, changes in vision or speech,  fevers > 100.4 unrelieved by medication, shortness of breath, intractable vomiting, or diarrhea, abdominal pain, Inability to tolerate liquids or food, cough, altered mental status or any concerns. No signs of systemic illness or infection. The patient is nontoxic-appearing on exam and vital signs are within normal limits.    I have reviewed the triage vital signs and the nursing notes. Pertinent labs &imaging results that were available during my care of the patient were reviewed by me and considered in my medical decision making (see chart for details).  After history, exam, and medical workup I feel the patient has been appropriately medically screened and is safe for discharge home. Pertinent diagnoses were discussed with the patient. Patient was given return precautions.     ED Discharge Orders        Ordered    oxyCODONE-acetaminophen (PERCOCET) 5-325 MG tablet  Every 6 hours PRN     02/03/17 0356    ondansetron (ZOFRAN ODT) 8 MG disintegrating tablet     02/03/17 0356    Diclofenac Sodium CR (VOLTAREN-XR) 100 MG 24 hr tablet  Daily     02/03/17 0356    tamsulosin (FLOMAX) 0.4 MG CAPS capsule  Daily     02/03/17 0356       Jaremy Nosal, MD 02/03/17 5284

## 2017-02-05 DIAGNOSIS — N202 Calculus of kidney with calculus of ureter: Secondary | ICD-10-CM | POA: Diagnosis not present

## 2017-02-08 DIAGNOSIS — N202 Calculus of kidney with calculus of ureter: Secondary | ICD-10-CM | POA: Diagnosis not present

## 2017-02-09 DIAGNOSIS — N201 Calculus of ureter: Secondary | ICD-10-CM | POA: Diagnosis not present

## 2017-02-14 DIAGNOSIS — N202 Calculus of kidney with calculus of ureter: Secondary | ICD-10-CM | POA: Diagnosis not present

## 2017-03-08 DIAGNOSIS — I10 Essential (primary) hypertension: Secondary | ICD-10-CM | POA: Diagnosis not present

## 2017-03-08 DIAGNOSIS — I2510 Atherosclerotic heart disease of native coronary artery without angina pectoris: Secondary | ICD-10-CM | POA: Diagnosis not present

## 2017-03-08 DIAGNOSIS — E782 Mixed hyperlipidemia: Secondary | ICD-10-CM | POA: Diagnosis not present

## 2017-03-08 DIAGNOSIS — E669 Obesity, unspecified: Secondary | ICD-10-CM | POA: Diagnosis not present

## 2017-03-08 DIAGNOSIS — Z79899 Other long term (current) drug therapy: Secondary | ICD-10-CM | POA: Diagnosis not present

## 2017-03-08 DIAGNOSIS — E119 Type 2 diabetes mellitus without complications: Secondary | ICD-10-CM | POA: Diagnosis not present

## 2017-03-23 DIAGNOSIS — N200 Calculus of kidney: Secondary | ICD-10-CM | POA: Diagnosis not present

## 2017-04-10 ENCOUNTER — Other Ambulatory Visit: Payer: Self-pay | Admitting: Urology

## 2017-05-15 ENCOUNTER — Encounter (HOSPITAL_COMMUNITY): Payer: Self-pay | Admitting: *Deleted

## 2017-05-21 ENCOUNTER — Encounter (HOSPITAL_COMMUNITY)
Admission: RE | Disposition: A | Discharge status: Home / Self Care | Payer: Self-pay | Source: Ambulatory Visit | Attending: Urology

## 2017-05-21 ENCOUNTER — Ambulatory Visit (HOSPITAL_COMMUNITY)
Admission: RE | Admit: 2017-05-21 | Discharge: 2017-05-21 | Disposition: A | Discharge status: Home / Self Care | Payer: Medicare Other | Source: Ambulatory Visit | Attending: Urology | Admitting: Urology

## 2017-05-21 ENCOUNTER — Other Ambulatory Visit: Payer: Self-pay

## 2017-05-21 ENCOUNTER — Ambulatory Visit (HOSPITAL_COMMUNITY): Payer: Medicare Other

## 2017-05-21 ENCOUNTER — Encounter (HOSPITAL_COMMUNITY): Payer: Self-pay | Admitting: *Deleted

## 2017-05-21 DIAGNOSIS — Z85828 Personal history of other malignant neoplasm of skin: Secondary | ICD-10-CM | POA: Diagnosis not present

## 2017-05-21 DIAGNOSIS — E119 Type 2 diabetes mellitus without complications: Secondary | ICD-10-CM | POA: Diagnosis not present

## 2017-05-21 DIAGNOSIS — Z7982 Long term (current) use of aspirin: Secondary | ICD-10-CM | POA: Diagnosis not present

## 2017-05-21 DIAGNOSIS — N200 Calculus of kidney: Secondary | ICD-10-CM | POA: Diagnosis not present

## 2017-05-21 DIAGNOSIS — Z8572 Personal history of non-Hodgkin lymphomas: Secondary | ICD-10-CM | POA: Diagnosis not present

## 2017-05-21 DIAGNOSIS — Z87442 Personal history of urinary calculi: Secondary | ICD-10-CM | POA: Diagnosis not present

## 2017-05-21 HISTORY — DX: Prediabetes: R73.03

## 2017-05-21 HISTORY — DX: Personal history of antineoplastic chemotherapy: Z92.21

## 2017-05-21 HISTORY — DX: Personal history of other medical treatment: Z92.89

## 2017-05-21 HISTORY — DX: Personal history of urinary calculi: Z87.442

## 2017-05-21 HISTORY — PX: EXTRACORPOREAL SHOCK WAVE LITHOTRIPSY: SHX1557

## 2017-05-21 SURGERY — LITHOTRIPSY, ESWL
Anesthesia: LOCAL | Laterality: Left

## 2017-05-21 MED ORDER — DIPHENHYDRAMINE HCL 25 MG PO CAPS
25.0000 mg | ORAL_CAPSULE | ORAL | Status: AC
  Administered 2017-05-21: 25 mg via ORAL
  Filled 2017-05-21: qty 1

## 2017-05-21 MED ORDER — SODIUM CHLORIDE 0.9 % IV SOLN
INTRAVENOUS | Status: DC
  Administered 2017-05-21: 07:00:00 via INTRAVENOUS

## 2017-05-21 MED ORDER — CIPROFLOXACIN HCL 500 MG PO TABS
500.0000 mg | ORAL_TABLET | ORAL | Status: AC
  Administered 2017-05-21: 500 mg via ORAL
  Filled 2017-05-21: qty 1

## 2017-05-21 MED ORDER — DIAZEPAM 5 MG PO TABS
10.0000 mg | ORAL_TABLET | ORAL | Status: AC
  Administered 2017-05-21: 10 mg via ORAL
  Filled 2017-05-21: qty 2

## 2017-05-21 SURGICAL SUPPLY — 2 items
COVER SURGICAL LIGHT HANDLE (MISCELLANEOUS) ×2 IMPLANT
TOWEL OR 17X26 10 PK STRL BLUE (TOWEL DISPOSABLE) ×2 IMPLANT

## 2017-05-21 NOTE — Discharge Instructions (Signed)
Lithotripsy, Care After °This sheet gives you information about how to care for yourself after your procedure. Your health care provider may also give you more specific instructions. If you have problems or questions, contact your health care provider. °What can I expect after the procedure? °After the procedure, it is common to have: °· Some blood in your urine. This should only last for a few days. °· Soreness in your back, sides, or upper abdomen for a few days. °· Blotches or bruises on your back where the pressure wave entered the skin. °· Pain, discomfort, or nausea when pieces (fragments) of the kidney stone move through the tube that carries urine from the kidney to the bladder (ureter). Stone fragments may pass soon after the procedure, but they may continue to pass for up to 4-8 weeks. °? If you have severe pain or nausea, contact your health care provider. This may be caused by a large stone that was not broken up, and this may mean that you need more treatment. °· Some pain or discomfort during urination. °· Some pain or discomfort in the lower abdomen or (in men) at the base of the penis. ° °Follow these instructions at home: °Medicines °· Take over-the-counter and prescription medicines only as told by your health care provider. °· If you were prescribed an antibiotic medicine, take it as told by your health care provider. Do not stop taking the antibiotic even if you start to feel better. °· Do not drive for 24 hours if you were given a medicine to help you relax (sedative). °· Do not drive or use heavy machinery while taking prescription pain medicine. °Eating and drinking °· Drink enough water and fluids to keep your urine clear or pale yellow. This helps any remaining pieces of the stone to pass. It can also help prevent new stones from forming. °· Eat plenty of fresh fruits and vegetables. °· Follow instructions from your health care provider about eating and drinking restrictions. You may be  instructed: °? To reduce how much salt (sodium) you eat or drink. Check ingredients and nutrition facts on packaged foods and beverages. °? To reduce how much meat you eat. °· Eat the recommended amount of calcium for your age and gender. Ask your health care provider how much calcium you should have. °General instructions °· Get plenty of rest. °· Most people can resume normal activities 1-2 days after the procedure. Ask your health care provider what activities are safe for you. °· If directed, strain all urine through the strainer that was provided by your health care provider. °? Keep all fragments for your health care provider to see. Any stones that are found may be sent to a medical lab for examination. The stone may be as small as a grain of salt. °· Keep all follow-up visits as told by your health care provider. This is important. °Contact a health care provider if: °· You have pain that is severe or does not get better with medicine. °· You have nausea that is severe or does not go away. °· You have blood in your urine longer than your health care provider told you to expect. °· You have more blood in your urine. °· You have pain during urination that does not go away. °· You urinate more frequently than usual and this does not go away. °· You develop a rash or any other possible signs of an allergic reaction. °Get help right away if: °· You have severe pain in   your back, sides, or upper abdomen. °· You have severe pain while urinating. °· Your urine is very dark red. °· You have blood in your stool (feces). °· You cannot pass any urine at all. °· You feel a strong urge to urinate after emptying your bladder. °· You have a fever or chills. °· You develop shortness of breath, difficulty breathing, or chest pain. °· You have severe nausea that leads to persistent vomiting. °· You faint. °Summary °· After this procedure, it is common to have some pain, discomfort, or nausea when pieces (fragments) of the  kidney stone move through the tube that carries urine from the kidney to the bladder (ureter). If this pain or nausea is severe, however, you should contact your health care provider. °· Most people can resume normal activities 1-2 days after the procedure. Ask your health care provider what activities are safe for you. °· Drink enough water and fluids to keep your urine clear or pale yellow. This helps any remaining pieces of the stone to pass, and it can help prevent new stones from forming. °· If directed, strain your urine and keep all fragments for your health care provider to see. Fragments or stones may be as small as a grain of salt. °· Get help right away if you have severe pain in your back, sides, or upper abdomen or have severe pain while urinating. °This information is not intended to replace advice given to you by your health care provider. Make sure you discuss any questions you have with your health care provider. °Document Released: 01/15/2007 Document Revised: 11/17/2015 Document Reviewed: 11/17/2015 °Elsevier Interactive Patient Education © 2018 [employer] °Moderate Conscious Sedation, Adult, Care After °These instructions provide you with information about caring for yourself after your procedure. Your health care provider may also give you more specific instructions. Your treatment has been planned according to current medical practices, but problems sometimes occur. Call your health care provider if you have any problems or questions after your procedure. °What can I expect after the procedure? °After your procedure, it is common: °· To feel sleepy for several hours. °· To feel clumsy and have poor balance for several hours. °· To have poor judgment for several hours. °· To vomit if you eat too soon. ° °Follow these instructions at home: °For at least 24 hours after the procedure: ° °· Do not: °? Participate in activities where you could fall or become injured. °? Drive. °? Use heavy  machinery. °? Drink alcohol. °? Take sleeping pills or medicines that cause drowsiness. °? Make important decisions or sign legal documents. °? Take care of children on your own. °· Rest. °Eating and drinking °· Follow the diet recommended by your health care provider. °· If you vomit: °? Drink water, juice, or soup when you can drink without vomiting. °? Make sure you have little or no nausea before eating solid foods. °General instructions °· Have a responsible adult stay with you until you are awake and alert. °· Take over-the-counter and prescription medicines only as told by your health care provider. °· If you smoke, do not smoke without supervision. °· Keep all follow-up visits as told by your health care provider. This is important. °Contact a health care provider if: °· You keep feeling nauseous or you keep vomiting. °· You feel light-headed. °· You develop a rash. °· You have a fever. °Get help right away if: °· You have trouble breathing. °This information is not intended to replace advice   given to you by your health care provider. Make sure you discuss any questions you have with your health care provider. Document Released: 10/16/2012 Document Revised: 05/31/2015 Document Reviewed: 04/17/2015 Elsevier Interactive Patient Education  2018 [place] discharge instructions in chart.

## 2017-05-21 NOTE — Progress Notes (Signed)
Pt and his sister verbalized understanding to not rebegin aspirin till urine free from blood.

## 2017-05-21 NOTE — H&P (Signed)
H&P  Chief Complaint: Right kidney stone  History of Present Illness: 67 year old male presents for ESL mgmt of a left renal stone. His history is as follows:  02/14/17  The patient removed his stent on Monday. He was initially doing well but then developed severe right-sided back pain. He took some ibuprofen and the pain resolved. Since then he has had no pain. No voiding complaints. No fever, nausea, vomiting. He is feeling very well today.   03/23/17  Patient presents today for a follow-up with renal ultrasound and KUB. Renal ultrasound revealed no evidence of hydronephrosis. KUB revealed a radiopaque 8 mm left renal calculus which was known to [place]. On review of his prior CT scan, the Hounsfield units were around 700-800. He has been asymptomatic with no pain, hematuria, or dysuria. He would like to have the left renal calculus addressed.     Past Medical History:  Diagnosis Date  . Herpes zoster infection of thoracic region    March, 2012  . History of kidney stones   . Hyperlipidemia, mixed   . Leukopenia   . Low grade B-cell lymphoma (HCC)    [place]  IPI low risk  Follicular grade 2, B cell  NHL dx Jan, 2001 right inguinal adenopathy  . Pre-diabetes   . Skin cancer    basal cell and squamous cell  . Thrombocytopenia, unspecified ([place])     Past Surgical History:  Procedure Laterality Date  . CARDIAC CATHETERIZATION N/A 02/26/2015   Procedure: Left Heart Cath and Coronary Angiography;  Surgeon: Jettie Booze, MD;  Location: [facility];  Service: Cardiovascular;  Laterality: N/A;  . DUPUYTREN / PALMAR FASCIOTOMY    . LN biopsy      Home Medications:    Allergies: No Known Allergies  Family History  Problem Relation Age of Onset  . Alzheimer's disease Father   . CAD Brother        LIMA to the LAD    Social History:  reports that he has never smoked. He has never used smokeless tobacco. He reports that he does not drink alcohol or use drugs.  ROS: A complete  review of systems was performed.  All systems are negative except for pertinent findings as noted.  Physical Exam:  Vital signs in last 24 hours:   Constitutional:  Alert and oriented, No acute distress Cardiovascular: Regular rate and rhythm, No JVD Respiratory: Normal respiratory effort Neurologic: Grossly intact, no focal deficits Psychiatric: Normal mood and affect  Laboratory Data:  No results for input(s): WBC, HGB, HCT, PLT in the last 72 hours.  No results for input(s): NA, K, CL, GLUCOSE, BUN, CALCIUM, CREATININE in the last 72 hours.  Invalid input(s): CO3   No results found for this or any previous visit (from the past 24 hour(s)). No results found for this or any previous visit (from the past 240 hour(s)).  Renal Function: No results for input(s): CREATININE in the last 168 hours. CrCl cannot be calculated (Patient's most recent lab result is older than the maximum 21 days allowed.).  Radiologic Imaging: No results found.  Impression/Assessment:  8 mm left renal stone  Plan:  Left ESL

## 2017-05-21 NOTE — Op Note (Signed)
See [facility] OP note scanned into chart. 

## 2017-05-22 ENCOUNTER — Encounter (HOSPITAL_COMMUNITY): Payer: Self-pay | Admitting: Urology

## 2017-05-25 DIAGNOSIS — N200 Calculus of kidney: Secondary | ICD-10-CM | POA: Diagnosis not present

## 2017-05-28 ENCOUNTER — Other Ambulatory Visit: Payer: Self-pay | Admitting: Urology

## 2017-05-28 ENCOUNTER — Inpatient Hospital Stay (HOSPITAL_COMMUNITY): Payer: Medicare Other | Admitting: Anesthesiology

## 2017-05-28 ENCOUNTER — Encounter (HOSPITAL_COMMUNITY)
Admission: RE | Disposition: A | Discharge status: Home / Self Care | Payer: Self-pay | Source: Ambulatory Visit | Attending: Urology

## 2017-05-28 ENCOUNTER — Encounter (HOSPITAL_COMMUNITY): Payer: Self-pay | Admitting: *Deleted

## 2017-05-28 ENCOUNTER — Ambulatory Visit (HOSPITAL_COMMUNITY)
Admission: RE | Admit: 2017-05-28 | Discharge: 2017-05-28 | Disposition: A | Discharge status: Home / Self Care | Payer: Medicare Other | Source: Ambulatory Visit | Attending: Urology | Admitting: Urology

## 2017-05-28 ENCOUNTER — Ambulatory Visit (HOSPITAL_COMMUNITY): Payer: Medicare Other

## 2017-05-28 DIAGNOSIS — N201 Calculus of ureter: Secondary | ICD-10-CM | POA: Diagnosis not present

## 2017-05-28 DIAGNOSIS — E119 Type 2 diabetes mellitus without complications: Secondary | ICD-10-CM | POA: Diagnosis not present

## 2017-05-28 DIAGNOSIS — Z7984 Long term (current) use of oral hypoglycemic drugs: Secondary | ICD-10-CM | POA: Insufficient documentation

## 2017-05-28 DIAGNOSIS — I2510 Atherosclerotic heart disease of native coronary artery without angina pectoris: Secondary | ICD-10-CM | POA: Diagnosis not present

## 2017-05-28 DIAGNOSIS — R739 Hyperglycemia, unspecified: Secondary | ICD-10-CM | POA: Diagnosis not present

## 2017-05-28 DIAGNOSIS — E7800 Pure hypercholesterolemia, unspecified: Secondary | ICD-10-CM | POA: Diagnosis not present

## 2017-05-28 DIAGNOSIS — Z79899 Other long term (current) drug therapy: Secondary | ICD-10-CM | POA: Insufficient documentation

## 2017-05-28 DIAGNOSIS — Z7982 Long term (current) use of aspirin: Secondary | ICD-10-CM | POA: Insufficient documentation

## 2017-05-28 DIAGNOSIS — D696 Thrombocytopenia, unspecified: Secondary | ICD-10-CM | POA: Diagnosis not present

## 2017-05-28 DIAGNOSIS — E782 Mixed hyperlipidemia: Secondary | ICD-10-CM | POA: Diagnosis not present

## 2017-05-28 HISTORY — PX: CYSTOSCOPY/URETEROSCOPY/HOLMIUM LASER: SHX6545

## 2017-05-28 LAB — BASIC METABOLIC PANEL
Anion gap: 10 (ref 5–15)
BUN: 19 mg/dL (ref 6–20)
CO2: 22 mmol/L (ref 22–32)
CREATININE: 1.47 mg/dL — AB (ref 0.61–1.24)
Calcium: 9.1 mg/dL (ref 8.9–10.3)
Chloride: 105 mmol/L (ref 101–111)
GFR calc Af Amer: 55 mL/min — ABNORMAL LOW (ref >60)
GFR, EST NON AFRICAN AMERICAN: 47 mL/min — AB (ref >60)
GLUCOSE: 112 mg/dL — AB (ref 65–99)
Potassium: 4.7 mmol/L (ref 3.5–5.1)
SODIUM: 137 mmol/L (ref 135–145)

## 2017-05-28 LAB — CBC
HCT: 40.4 % (ref 39.0–52.0)
Hemoglobin: 13.5 g/dL (ref 13.0–17.0)
MCH: 33.3 pg (ref 26.0–34.0)
MCHC: 33.4 g/dL (ref 30.0–36.0)
MCV: 99.8 fL (ref 78.0–100.0)
PLATELETS: 173 10*3/uL (ref 150–400)
RBC: 4.05 MIL/uL — AB (ref 4.22–5.81)
RDW: 13.1 % (ref 11.5–15.5)
WBC: 6.5 10*3/uL (ref 4.0–10.5)

## 2017-05-28 LAB — GLUCOSE, CAPILLARY
GLUCOSE-CAPILLARY: 102 mg/dL — AB (ref 65–99)
GLUCOSE-CAPILLARY: 105 mg/dL — AB (ref 65–99)

## 2017-05-28 SURGERY — CYSTOURETEROSCOPY, USING HOLMIUM LASER
Anesthesia: General | Site: Ureter | Laterality: Left

## 2017-05-28 MED ORDER — OXYCODONE-ACETAMINOPHEN 5-325 MG PO TABS: 1.0000 | ORAL_TABLET | Freq: Four times a day (QID) | ORAL | 0 refills | Status: AC | PRN

## 2017-05-28 MED ORDER — DEXAMETHASONE SODIUM PHOSPHATE 10 MG/ML IJ SOLN
INTRAMUSCULAR | Status: DC | PRN
  Administered 2017-05-28: 5 mg via INTRAVENOUS

## 2017-05-28 MED ORDER — IOHEXOL 300 MG/ML  SOLN
INTRAMUSCULAR | Status: DC | PRN
  Administered 2017-05-28: 4 mL via INTRAVENOUS

## 2017-05-28 MED ORDER — LACTATED RINGERS IV SOLN
INTRAVENOUS | Status: DC
  Administered 2017-05-28 (×2): via INTRAVENOUS

## 2017-05-28 MED ORDER — CEFAZOLIN SODIUM-DEXTROSE 2-4 GM/100ML-% IV SOLN
2.0000 g | INTRAVENOUS | Status: AC
  Administered 2017-05-28: 2 g via INTRAVENOUS
  Filled 2017-05-28: qty 100

## 2017-05-28 MED ORDER — SODIUM CHLORIDE 0.9 % IR SOLN
Status: DC | PRN
  Administered 2017-05-28: 3000 mL

## 2017-05-28 MED ORDER — PROPOFOL 10 MG/ML IV BOLUS
INTRAVENOUS | Status: DC | PRN
  Administered 2017-05-28: 160 mg via INTRAVENOUS

## 2017-05-28 MED ORDER — PROMETHAZINE HCL 25 MG/ML IJ SOLN: 6.2500 mg | INTRAMUSCULAR | Status: DC | PRN

## 2017-05-28 MED ORDER — FENTANYL CITRATE (PF) 100 MCG/2ML IJ SOLN
INTRAMUSCULAR | Status: AC
  Filled 2017-05-28: qty 2

## 2017-05-28 MED ORDER — FENTANYL CITRATE (PF) 100 MCG/2ML IJ SOLN: 25.0000 ug | INTRAMUSCULAR | Status: DC | PRN

## 2017-05-28 MED ORDER — OXYCODONE-ACETAMINOPHEN 5-325 MG PO TABS
1.0000 | ORAL_TABLET | Freq: Once | ORAL | Status: DC | PRN
Start: 2017-05-28 — End: 2017-05-28

## 2017-05-28 MED ORDER — ONDANSETRON HCL 4 MG/2ML IJ SOLN
INTRAMUSCULAR | Status: DC | PRN
  Administered 2017-05-28: 4 mg via INTRAVENOUS

## 2017-05-28 MED ORDER — MIDAZOLAM HCL 5 MG/5ML IJ SOLN
INTRAMUSCULAR | Status: DC | PRN
  Administered 2017-05-28: 2 mg via INTRAVENOUS

## 2017-05-28 MED ORDER — FENTANYL CITRATE (PF) 100 MCG/2ML IJ SOLN
INTRAMUSCULAR | Status: DC | PRN
  Administered 2017-05-28: 100 ug via INTRAVENOUS
  Administered 2017-05-28: 60 ug via INTRAVENOUS

## 2017-05-28 MED ORDER — MIDAZOLAM HCL 2 MG/2ML IJ SOLN
INTRAMUSCULAR | Status: AC
  Filled 2017-05-28: qty 2

## 2017-05-28 SURGICAL SUPPLY — 24 items
BAG URO CATCHER STRL LF (MISCELLANEOUS) ×3 IMPLANT
BASKET LASER NITINOL 1.9FR (BASKET) IMPLANT
BASKET ZERO TIP NITINOL 2.4FR (BASKET) ×2 IMPLANT
BSKT STON RTRVL 120 1.9FR (BASKET)
BSKT STON RTRVL ZERO TP 2.4FR (BASKET) ×1
CATH INTERMIT  6FR 70CM (CATHETERS) ×2 IMPLANT
CATH URET 5FR 28IN CONE TIP (BALLOONS)
CATH URET 5FR 70CM CONE TIP (BALLOONS) IMPLANT
CLOTH BEACON ORANGE TIMEOUT ST (SAFETY) ×3 IMPLANT
COVER FOOTSWITCH UNIV (MISCELLANEOUS) ×3 IMPLANT
EXTRACTOR STONE 1.7FRX115CM (UROLOGICAL SUPPLIES) IMPLANT
FIBER LASER FLEXIVA 365 (UROLOGICAL SUPPLIES) IMPLANT
FIBER LASER TRAC TIP (UROLOGICAL SUPPLIES) ×2 IMPLANT
GLOVE BIOGEL M STRL SZ7.5 (GLOVE) ×3 IMPLANT
GOWN STRL REUS W/TWL XL LVL3 (GOWN DISPOSABLE) ×3 IMPLANT
GUIDEWIRE ANG ZIPWIRE 038X150 (WIRE) IMPLANT
GUIDEWIRE STR DUAL SENSOR (WIRE) ×3 IMPLANT
INFUSOR MANOMETER BAG 3000ML (MISCELLANEOUS) IMPLANT
MANIFOLD NEPTUNE II (INSTRUMENTS) ×3 IMPLANT
PACK CYSTO (CUSTOM PROCEDURE TRAY) ×3 IMPLANT
SHEATH URETERAL 12FRX28CM (UROLOGICAL SUPPLIES) IMPLANT
SHEATH URETERAL 12FRX35CM (MISCELLANEOUS) IMPLANT
STENT URET 6FRX26 CONTOUR (STENTS) ×2 IMPLANT
TUBING UROLOGY SET (TUBING) ×3 IMPLANT

## 2017-05-28 NOTE — Transfer of Care (Signed)
Immediate Anesthesia Transfer of Care Note  Patient: Frank Walsh  Procedure(s) Performed: CYSTOSCOPY/URETEROSCOPY/HOLMIUM LASER (Left Ureter)  Patient Location: PACU  Anesthesia Type:General  Level of Consciousness: awake, alert  and oriented  Airway & Oxygen Therapy: Patient Spontanous Breathing and Patient connected to face mask oxygen  Post-op Assessment: Report given to RN and Post -op Vital signs reviewed and stable  Post vital signs: Reviewed and stable  Last Vitals:  Vitals Value Taken Time  BP 156/94 05/28/2017  5:42 PM  Temp    Pulse 68 05/28/2017  5:43 PM  Resp 16 05/28/2017  5:43 PM  SpO2 100 % 05/28/2017  5:43 PM  Vitals shown include unvalidated device data.  Last Pain:  Vitals:   05/28/17 1516  TempSrc:   PainSc: 3       Patients Stated Pain Goal: 4 (32/12/24 8250)  Complications: No apparent anesthesia complications

## 2017-05-28 NOTE — Discharge Summary (Signed)
Physician Discharge Summary  Patient ID: Frank Walsh MRN: [id number] DOB/AGE: 1950/09/26 67 y.o.  Admit date: 05/28/2017 Discharge date: 05/28/2017  Admission Diagnoses:  Discharge Diagnoses:  Active Problems:   * No active hospital problems. *   Discharged Condition: good  Hospital Course: Patient underwent a left ureteroscopy with laser lithotripsy and ureteral stent placement and tolerated the procedure well and was discharged postoperatively.  Consults: None  Significant Diagnostic Studies: None  Treatments: surgery: As above  Discharge Exam: Blood pressure (!) 159/92, pulse 62, temperature 98.3 F (36.8 C), resp. rate 15, height 6\' 1"  (1.854 m), weight 103 kg (227 lb), SpO2 99 %. General appearance: alert no acute distress Adequate perfusion of extremities Unlabored respirations Abdomen soft nontender nondistended  Disposition: Discharge disposition: 01-Home or Self Care        Allergies as of 05/28/2017   No Known Allergies     Medication List    TAKE these medications   acetaminophen 325 MG tablet Commonly known as:  TYLENOL Take 650 mg by mouth every 6 (six) hours as needed for moderate pain.   aspirin 81 MG tablet Take 81 mg by mouth daily.   atorvastatin 10 MG tablet Commonly known as:  LIPITOR Take 10 mg by mouth daily.   ibuprofen 200 MG tablet Commonly known as:  ADVIL,MOTRIN Take 600 mg by mouth every 6 (six) hours as needed for moderate pain.   lisinopril 2.5 MG tablet Commonly known as:  PRINIVIL,ZESTRIL Take 2.5 mg by mouth daily.   magnesium citrate Soln Take 1 Bottle by mouth once.   metFORMIN 500 MG tablet Commonly known as:  GLUCOPHAGE Take 1 tablet (500 mg total) by mouth daily.   MULTIVITAMIN PO Take 1 tablet by mouth daily.   ondansetron 8 MG disintegrating tablet Commonly known as:  ZOFRAN ODT 8mg  ODT q8 hours prn nausea   oxyCODONE-acetaminophen 5-325 MG tablet Commonly known as:  PERCOCET Take 1 tablet by  mouth every 6 (six) hours as needed. What changed:  reasons to take this   polyethylene glycol packet Commonly known as:  MIRALAX / GLYCOLAX Take 17 g by mouth daily as needed for moderate constipation.   tamsulosin 0.4 MG Caps capsule Commonly known as:  FLOMAX Take 1 capsule (0.4 mg total) by mouth daily.        Signed: Marton Redwood, III 05/28/2017, 6:58 PM

## 2017-05-28 NOTE — Anesthesia Postprocedure Evaluation (Signed)
Anesthesia Post Note  Patient: Frank Walsh  Procedure(s) Performed: CYSTOSCOPY/URETEROSCOPY/HOLMIUM LASER (Left Ureter)     Patient location during evaluation: PACU Anesthesia Type: General Level of consciousness: awake and alert Pain management: pain level controlled Vital Signs Assessment: post-procedure vital signs reviewed and stable Respiratory status: spontaneous breathing, nonlabored ventilation, respiratory function stable and patient connected to nasal cannula oxygen Cardiovascular status: blood pressure returned to baseline and stable Postop Assessment: no apparent nausea or vomiting Anesthetic complications: no    Last Vitals:  Vitals:   05/28/17 1800 05/28/17 1815  BP: (!) 164/89 (!) 159/92  Pulse: 63 62  Resp: 16 15  Temp:    SpO2: 98% 99%    Last Pain:  Vitals:   05/28/17 1815  TempSrc:   PainSc: 0-No pain                 [name] S

## 2017-05-28 NOTE — Op Note (Signed)
Operative Note  Preoperative diagnosis:  1.  Left ureteral calculi  Postoperative diagnosis: 1.  Left ureteral calculi  Procedure(s): 1.  Cystoscopy with left retrograde pyelogram, left ureteroscopy with laser lithotripsy and stone basketing, left ureteral stent placement  Surgeon: Link Snuffer, MD  Assistants: None  Anesthesia: General  Complications: None immediate  EBL: Minimal  Specimens: 1.  None  Drains/Catheters: 1.  6 x 26 double-J ureteral stent  Intraoperative findings: Multiple left ureteral calculi largest of which was about 5 to 6 mm.  Left retrograde pyelogram at the conclusion of the case revealed a well opacified kidney with mild hydronephrosis.  There was no significant filling defect.  Indication: 67 year old male who recently underwent a left ESWL and presented with left-sided flank pain.  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  Given continued pain and a second visit to the office, decision was made to proceed with the above operation.  Description of procedure:  The patient was identified and consent was obtained.  The patient was taken to the operating room and placed in the supine position.  The patient was placed under general anesthesia.  Perioperative antibiotics were administered.  The patient was placed in dorsal lithotomy.  Patient was prepped and draped in a standard sterile fashion and a timeout was performed.  A 21 French rigid cystoscope was advanced into the urethra and into the bladder.  A complete cystoscopy was performed with no abnormal findings.  The left ureter was cannulated with a sensor wire which was advanced up to the kidney for fluoroscopic guidance.  A semirigid ureteroscope was advanced alongside the wire up to the stones of interest.  Smaller fragments were basket extracted but there were some larger fragments to big to basket.  Therefore laser fiber was used to fragment these fragments to 1 mm or less fragments.  There were  multiple.  By the end of lasering, I did not see any clinically significant stone fragments.  I performed a complete pyeloscopy up to the renal pelvis and shot a retrograde pyelogram at this level.  I also withdrew the scope slowly and visualize the entire ureter upon removal.  There was some edema at the level where the stones were impacted but no significant injury was seen to the ureter.  There were no other clinically significant stone fragments visualized.  I then backloaded the wire onto a rigid cystoscope which was advanced into the bladder.  I then placed a 6 x 26 double-J ureteral stent in a standard fashion followed by removal of the wire.  Fluoroscopy confirmed proximal placement and direct visualization confirmed a good coil within the bladder.  The bladder was drained and the scope was withdrawn.  Patient tolerated the procedure well and was stable postoperatively.  Plan: Return in about 1 week for stent removal.

## 2017-05-28 NOTE — Anesthesia Preprocedure Evaluation (Signed)
Anesthesia Evaluation  Patient identified by MRN, date of birth, ID band Patient awake    Reviewed: Allergy & Precautions, NPO status , Patient's Chart, lab work & pertinent test results  Airway Mallampati: II  TM Distance: >3 FB Neck ROM: Full    Dental no notable dental hx.    Pulmonary neg pulmonary ROS,    Pulmonary exam normal breath sounds clear to auscultation       Cardiovascular negative cardio ROS Normal cardiovascular exam Rhythm:Regular Rate:Normal     Neuro/Psych negative neurological ROS  negative psych ROS   GI/Hepatic negative GI ROS, Neg liver ROS,   Endo/Other  negative endocrine ROS  Renal/GU negative Renal ROS  negative genitourinary   Musculoskeletal negative musculoskeletal ROS (+)   Abdominal   Peds negative pediatric ROS (+)  Hematology negative hematology ROS (+)   Anesthesia Other Findings   Reproductive/Obstetrics negative OB ROS                             Anesthesia Physical Anesthesia Plan  ASA: II  Anesthesia Plan: General   Post-op Pain Management:    Induction: Intravenous  PONV Risk Score and Plan: 2 and Ondansetron, Dexamethasone and Treatment may vary due to age or medical condition  Airway Management Planned: LMA  Additional Equipment:   Intra-op Plan:   Post-operative Plan: Extubation in OR  Informed Consent: I have reviewed the patients History and Physical, chart, labs and discussed the procedure including the risks, benefits and alternatives for the proposed anesthesia with the patient or authorized representative who has indicated his/her understanding and acceptance.     Dental advisory given  Plan Discussed with: CRNA and Surgeon  Anesthesia Plan Comments:         Anesthesia Quick Evaluation  

## 2017-05-28 NOTE — Anesthesia Procedure Notes (Signed)
Procedure Name: LMA Insertion Performed by: Malisha Mabey J, CRNA Pre-anesthesia Checklist: Patient identified, Emergency Drugs available, Suction available, Patient being monitored and Timeout performed Patient Re-evaluated:Patient Re-evaluated prior to induction Oxygen Delivery Method: Circle system utilized Preoxygenation: Pre-oxygenation with 100% oxygen Induction Type: IV induction Ventilation: Mask ventilation without difficulty LMA: LMA inserted LMA Size: 4.0 Number of attempts: 1 Placement Confirmation: positive ETCO2,  CO2 detector and breath sounds checked- equal and bilateral Tube secured with: Tape Dental Injury: Teeth and Oropharynx as per pre-operative assessment        

## 2017-05-28 NOTE — Interval H&P Note (Signed)
History and Physical Interval Note:  05/28/2017 3:50 PM  Frank Walsh  has presented today for surgery, with the diagnosis of left ureteral stone  The various methods of treatment have been discussed with the patient and family. After consideration of risks, benefits and other options for treatment, the patient has consented to  Procedure(s): CYSTOSCOPY/URETEROSCOPY/HOLMIUM LASER (Left) as a surgical intervention .  The patient's history has been reviewed, patient examined, no change in status, stable for surgery.  I have reviewed the patient's chart and labs.  Questions were answered to the patient's satisfaction.     Marton Redwood, III

## 2017-05-28 NOTE — H&P (Signed)
CC: I have kidney stones.(Surgery)  HPI: Frank Walsh is a 67 year-old male established patient who is here for renal calculi after a surgical intervention.  The problem is on the left side. He had stent, ureteroscopy, and eswl for treatment of his renal calculi. Patient denies percutaneous lithotomy. This procedure was done 05/18/2017. He did pass stone fragments. This is not his first kidney stone. He does not have a stent in place.   He is currently having flank pain, back pain, and groin pain. He denies having nausea, vomiting, fever, and chills.   He does not have dysuria. He does not have urgency. He does not have frequency.   The patient has undergone a right ureteroscopy with laser lithotripsy in the past. Most recently, he underwent a left ESWL. Several days later, he developed severe pain. He continues to have severe pain that is intermittent in nature. He had a KUB on Friday that revealed Steinstrasse in the proximal to mid ureter. KUB redemonstrated this today with some interval migration a small amount distally. The patient is about to go on vacation and greatly desires intervention especially considering the intermittent pain.   ALLERGIES: No Allergies    MEDICATIONS: Lisinopril 2.5 mg tablet  Metformin Hcl 500 mg tablet  Tamsulosin Hcl 0.4 mg capsule 1 capsule PO Q HS  Advil  Aspirin Ec 81 mg tablet, delayed release  Atorvastatin Calcium 10 mg tablet  Multiple Vitamin  Ondansetron Odt 8 mg tablet,disintegrating  Tylenol  Vitamin C    GU PSH: ESWL, Left - 05/21/2017 Ureteroscopic laser litho, Right - 02/09/2017     PSH Notes: -lazer surgery for detached retina   -cardiac cath   -dupuytren/ palmar fasciotomy   NON-GU PSH: Hand/finger Surgery, Right   GU PMH: Renal calculus - 03/23/2017 Renal and ureteral calculus - 02/14/2017, - 02/08/2017, Right, - 02/05/2017     PMH Notes:  1898-01-09 00:00:00 - Note: Normal Routine History And Physical Adult   NON-GU PMH: Coronary  Artery Disease Diabetes Type 2 Hypercholesterolemia Non-Hodgkin lymphoma, unspecified, unspecified site   FAMILY HISTORY: Alzheimer's Disease - Father Death of family member - Father, Mother   SOCIAL HISTORY: Marital Status: Single Preferred Language: English; Ethnicity: Not Hispanic Or Latino; Race: White Current Smoking Status: Patient has never smoked.   Tobacco Use Assessment Completed:  Used Tobacco in last 30 days?  Does not use smokeless tobacco. Has never drank.  Drinks 1 caffeinated drink per day.   REVIEW OF SYSTEMS:    GU Review Male:   Patient denies frequent urination, hard to postpone urination, burning/ pain with urination, get up at night to urinate, leakage of urine, stream starts and stops, trouble starting your stream, have to strain to urinate , erection problems, and penile pain.  Gastrointestinal (Upper):   Patient denies nausea, vomiting, and indigestion/ heartburn.  Gastrointestinal (Lower):   Patient reports constipation. Patient denies diarrhea.  Constitutional:   Patient denies fever, night sweats, weight loss, and fatigue.  Skin:   Patient denies skin rash/ lesion and itching.  Eyes:   Patient denies blurred vision and double vision.  Ears/ Nose/ Throat:   Patient denies sore throat and sinus problems.  Hematologic/Lymphatic:   Patient denies swollen glands and easy bruising.  Cardiovascular:   Patient denies leg swelling and chest pains.  Respiratory:   Patient denies cough and shortness of breath.  Endocrine:   Patient denies excessive thirst.  Musculoskeletal:   Patient denies back pain and joint pain.  Neurological:  Patient denies headaches and dizziness.  Psychologic:   Patient denies depression and anxiety.   VITAL SIGNS:      05/28/2017 10:33 AM  BP 143/87 mmHg  Heart Rate 69 /min  Temperature 98.2 F / 36.7 C   MULTI-SYSTEM PHYSICAL EXAMINATION:    Constitutional: Well-nourished. No physical deformities. Normally developed. Good grooming.    Respiratory: No labored breathing, no use of accessory muscles.   Cardiovascular: Normal temperature, adequate perfusion of extremities  Skin: No paleness, no jaundice  Neurologic / Psychiatric: Oriented to time, oriented to place, oriented to person. No depression, no anxiety, no agitation.   Gastrointestinal: No mass, no tenderness, no rigidity, non obese abdomen.   Eyes: Normal conjunctivae. Normal eyelids.  Musculoskeletal: Normal gait and station of head and neck.    PAST DATA REVIEWED:  Source Of History:  Patient  Records Review:   Previous Patient Records  X-Ray Review: KUB: Reviewed Films. Discussed With Patient.    PROCEDURES:         KUB - [id number]  A single view of the abdomen is obtained.  Stone fragments redemonstrated admitting proximal to mid left ureter              Urinalysis Dipstick Dipstick Cont'd  Color: Yellow Bilirubin: Neg  Appearance: Clear Ketones: Neg  Specific Gravity: 1.015 Blood: Neg  pH: <=5.0 Protein: Neg  Glucose: Neg Urobilinogen: 0.2    Nitrites: Neg    Leukocyte Esterase: Neg   ASSESSMENT:      ICD-10 Details  1 GU:   Ureteral calculus - N20.1   2   Flank Pain - R10.84    PLAN:          Orders X-Rays: KUB         Schedule          Document Letter(s):  Created for Patient: Clinical Summary        Notes:   I discussed further observation and conservative management versus ureteroscopy with laser lithotripsy and ureteral stent placement. He greatly desires intervention. We will try to get this done today.        Next Appointment:      Next Appointment: 06/05/2017 09:30 AM    Appointment Type: Office Visit Established Patient    Location: [facility] [phone]    Provider: Jiles Crocker    Reason for Visit: post Bretta Bang, 2 week check     Signed by Link Snuffer, III, M.D. on 05/28/17 at 11:36 AM (EDT

## 2017-05-28 NOTE — Discharge Instructions (Signed)

## 2017-05-29 ENCOUNTER — Encounter (HOSPITAL_COMMUNITY): Payer: Self-pay | Admitting: Urology

## 2017-06-07 DIAGNOSIS — N202 Calculus of kidney with calculus of ureter: Secondary | ICD-10-CM | POA: Diagnosis not present

## 2017-07-18 ENCOUNTER — Encounter (INDEPENDENT_AMBULATORY_CARE_PROVIDER_SITE_OTHER): Payer: Medicare Other | Admitting: Ophthalmology

## 2017-08-28 DIAGNOSIS — Z6830 Body mass index (BMI) 30.0-30.9, adult: Secondary | ICD-10-CM | POA: Diagnosis not present

## 2017-08-28 DIAGNOSIS — Z0001 Encounter for general adult medical examination with abnormal findings: Secondary | ICD-10-CM | POA: Diagnosis not present

## 2017-08-28 DIAGNOSIS — I10 Essential (primary) hypertension: Secondary | ICD-10-CM | POA: Diagnosis not present

## 2017-08-28 DIAGNOSIS — I2510 Atherosclerotic heart disease of native coronary artery without angina pectoris: Secondary | ICD-10-CM | POA: Diagnosis not present

## 2017-08-28 DIAGNOSIS — Z125 Encounter for screening for malignant neoplasm of prostate: Secondary | ICD-10-CM | POA: Diagnosis not present

## 2017-08-28 DIAGNOSIS — Z7984 Long term (current) use of oral hypoglycemic drugs: Secondary | ICD-10-CM | POA: Diagnosis not present

## 2017-08-28 DIAGNOSIS — Z1389 Encounter for screening for other disorder: Secondary | ICD-10-CM | POA: Diagnosis not present

## 2017-08-28 DIAGNOSIS — E782 Mixed hyperlipidemia: Secondary | ICD-10-CM | POA: Diagnosis not present

## 2017-08-28 DIAGNOSIS — E669 Obesity, unspecified: Secondary | ICD-10-CM | POA: Diagnosis not present

## 2017-08-28 DIAGNOSIS — Z79899 Other long term (current) drug therapy: Secondary | ICD-10-CM | POA: Diagnosis not present

## 2017-08-28 DIAGNOSIS — E559 Vitamin D deficiency, unspecified: Secondary | ICD-10-CM | POA: Diagnosis not present

## 2017-08-28 DIAGNOSIS — E119 Type 2 diabetes mellitus without complications: Secondary | ICD-10-CM | POA: Diagnosis not present

## 2017-09-04 ENCOUNTER — Other Ambulatory Visit: Payer: Self-pay | Admitting: Adult Health

## 2017-09-04 DIAGNOSIS — Z8579 Personal history of other malignant neoplasms of lymphoid, hematopoietic and related tissues: Secondary | ICD-10-CM

## 2017-09-05 ENCOUNTER — Inpatient Hospital Stay: Payer: Medicare Other

## 2017-09-05 ENCOUNTER — Inpatient Hospital Stay: Payer: Medicare Other | Attending: Adult Health | Admitting: Adult Health

## 2017-09-05 ENCOUNTER — Encounter: Payer: Self-pay | Admitting: Adult Health

## 2017-09-11 DIAGNOSIS — H524 Presbyopia: Secondary | ICD-10-CM | POA: Diagnosis not present

## 2017-09-11 DIAGNOSIS — H5213 Myopia, bilateral: Secondary | ICD-10-CM | POA: Diagnosis not present

## 2017-09-11 DIAGNOSIS — H2513 Age-related nuclear cataract, bilateral: Secondary | ICD-10-CM | POA: Diagnosis not present

## 2017-09-11 DIAGNOSIS — H52223 Regular astigmatism, bilateral: Secondary | ICD-10-CM | POA: Diagnosis not present

## 2017-10-30 DIAGNOSIS — Z23 Encounter for immunization: Secondary | ICD-10-CM | POA: Diagnosis not present

## 2018-02-27 DIAGNOSIS — H5213 Myopia, bilateral: Secondary | ICD-10-CM | POA: Diagnosis not present

## 2018-02-27 DIAGNOSIS — H25813 Combined forms of age-related cataract, bilateral: Secondary | ICD-10-CM | POA: Diagnosis not present

## 2018-02-27 DIAGNOSIS — H52223 Regular astigmatism, bilateral: Secondary | ICD-10-CM | POA: Diagnosis not present

## 2018-06-28 DIAGNOSIS — S6991XA Unspecified injury of right wrist, hand and finger(s), initial encounter: Secondary | ICD-10-CM | POA: Diagnosis not present

## 2018-06-28 DIAGNOSIS — Z79899 Other long term (current) drug therapy: Secondary | ICD-10-CM | POA: Diagnosis not present

## 2018-06-28 DIAGNOSIS — S68129A Partial traumatic metacarpophalangeal amputation of unspecified finger, initial encounter: Secondary | ICD-10-CM | POA: Diagnosis not present

## 2018-06-28 DIAGNOSIS — S61212A Laceration without foreign body of right middle finger without damage to nail, initial encounter: Secondary | ICD-10-CM | POA: Diagnosis not present

## 2018-06-28 DIAGNOSIS — S68122A Partial traumatic metacarpophalangeal amputation of right middle finger, initial encounter: Secondary | ICD-10-CM | POA: Diagnosis not present

## 2018-06-28 DIAGNOSIS — Z7984 Long term (current) use of oral hypoglycemic drugs: Secondary | ICD-10-CM | POA: Diagnosis not present

## 2018-06-28 DIAGNOSIS — Z7982 Long term (current) use of aspirin: Secondary | ICD-10-CM | POA: Diagnosis not present

## 2018-07-01 DIAGNOSIS — S62632B Displaced fracture of distal phalanx of right middle finger, initial encounter for open fracture: Secondary | ICD-10-CM | POA: Diagnosis not present

## 2018-07-01 DIAGNOSIS — M720 Palmar fascial fibromatosis [Dupuytren]: Secondary | ICD-10-CM | POA: Diagnosis not present

## 2018-10-14 DIAGNOSIS — Z23 Encounter for immunization: Secondary | ICD-10-CM | POA: Diagnosis not present

## 2018-12-03 DIAGNOSIS — E782 Mixed hyperlipidemia: Secondary | ICD-10-CM | POA: Diagnosis not present

## 2018-12-03 DIAGNOSIS — Z1389 Encounter for screening for other disorder: Secondary | ICD-10-CM | POA: Diagnosis not present

## 2018-12-03 DIAGNOSIS — E119 Type 2 diabetes mellitus without complications: Secondary | ICD-10-CM | POA: Diagnosis not present

## 2018-12-03 DIAGNOSIS — I10 Essential (primary) hypertension: Secondary | ICD-10-CM | POA: Diagnosis not present

## 2018-12-03 DIAGNOSIS — Z79899 Other long term (current) drug therapy: Secondary | ICD-10-CM | POA: Diagnosis not present

## 2018-12-03 DIAGNOSIS — E669 Obesity, unspecified: Secondary | ICD-10-CM | POA: Diagnosis not present

## 2018-12-03 DIAGNOSIS — E559 Vitamin D deficiency, unspecified: Secondary | ICD-10-CM | POA: Diagnosis not present

## 2018-12-03 DIAGNOSIS — Z0001 Encounter for general adult medical examination with abnormal findings: Secondary | ICD-10-CM | POA: Diagnosis not present

## 2018-12-03 DIAGNOSIS — I2510 Atherosclerotic heart disease of native coronary artery without angina pectoris: Secondary | ICD-10-CM | POA: Diagnosis not present

## 2018-12-03 DIAGNOSIS — Z125 Encounter for screening for malignant neoplasm of prostate: Secondary | ICD-10-CM | POA: Diagnosis not present

## 2018-12-20 IMAGING — CT CT RENAL STONE PROTOCOL
2 of 4 series · 16 of 46 positions shown, 18 images · non-contrast
Comparison: Prior CT from 03/02/2009.

CLINICAL DATA: Initial evaluation for acute right flank pain.

EXAM:
CT ABDOMEN AND PELVIS WITHOUT CONTRAST
TECHNIQUE: Multidetector CT imaging of the abdomen and pelvis was performed
following the standard protocol without IV contrast.

[Series 2: axial st · axial · 0.87mm/px · z∈[-492,-42]mm · 13 of 103 slices shown, 15 images]
[im 7/103  soft-tissue]
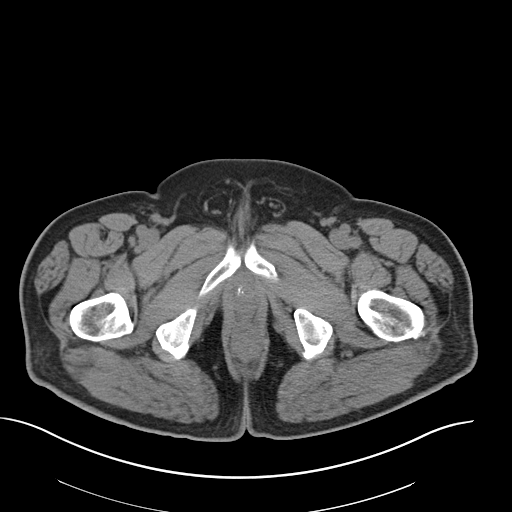
[im 7/103  bone]
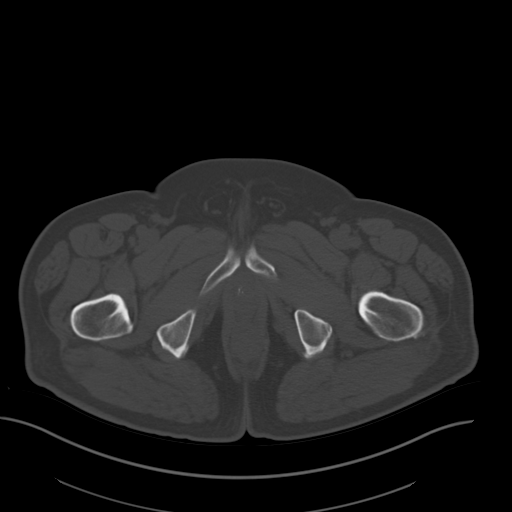
[im 13/103  soft-tissue]
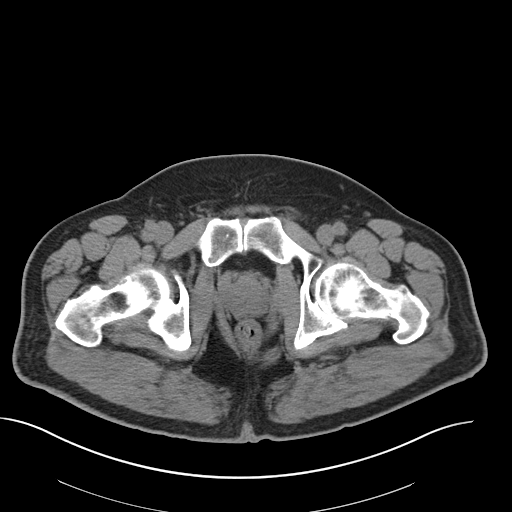
[im 25/103  soft-tissue]
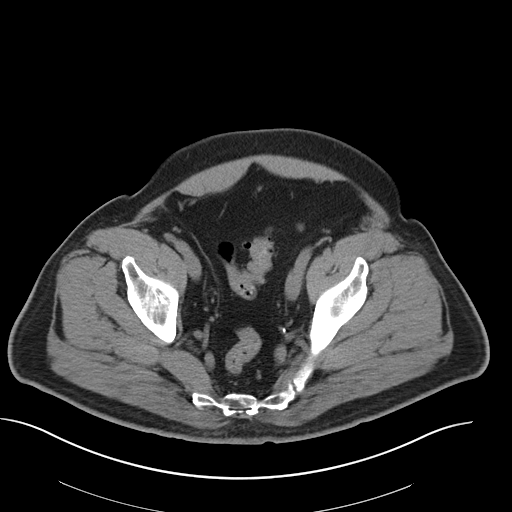
[im 31/103  soft-tissue]
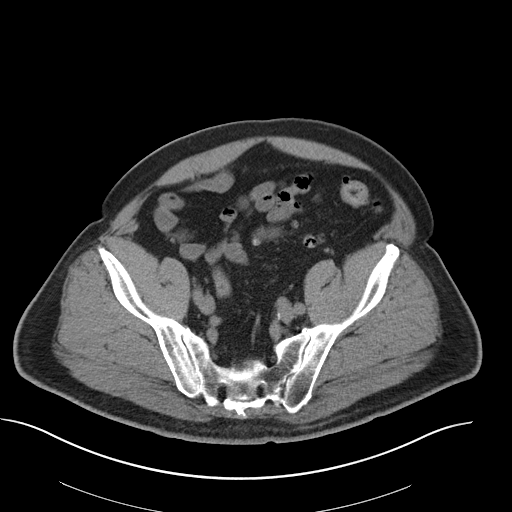
[im 37/103  soft-tissue]
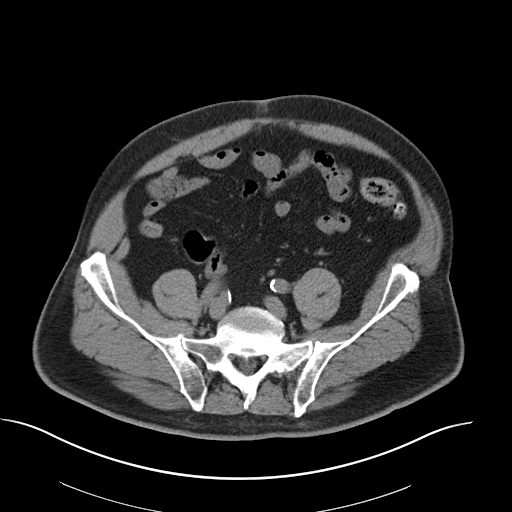
[im 43/103  soft-tissue]
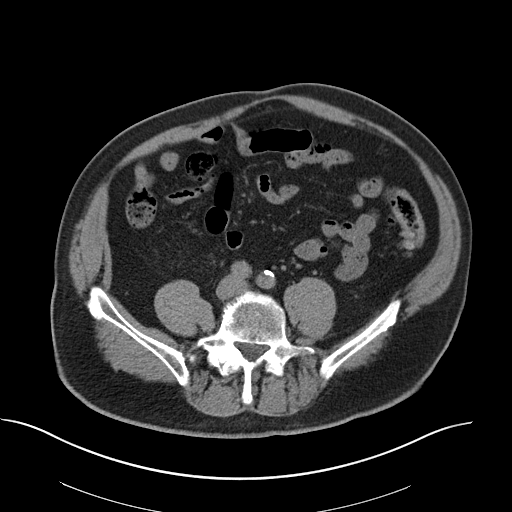
[im 55/103  soft-tissue]
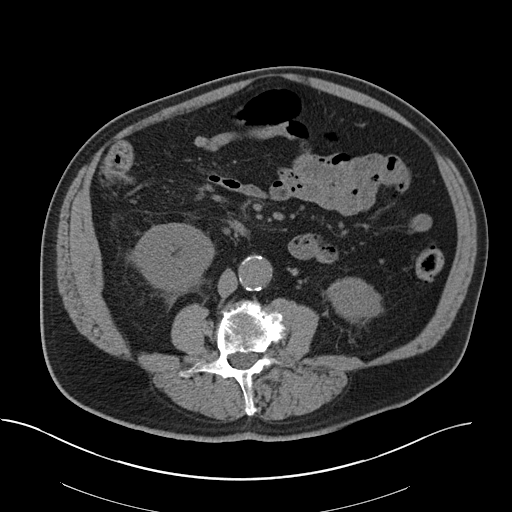
[im 61/103  soft-tissue]
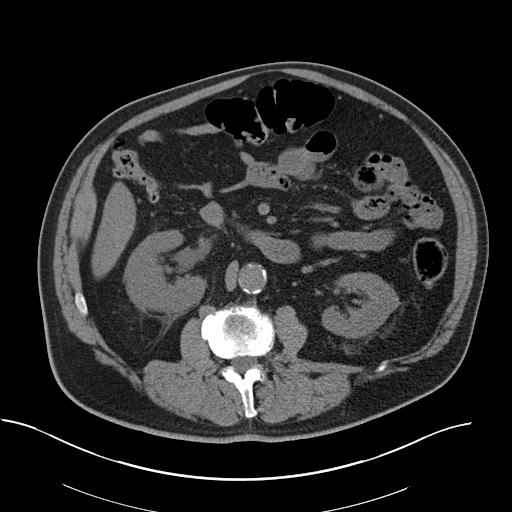
[im 67/103  soft-tissue]
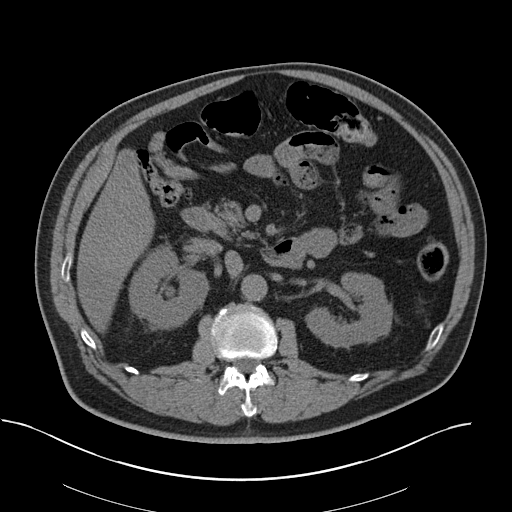
[im 67/103  bone]
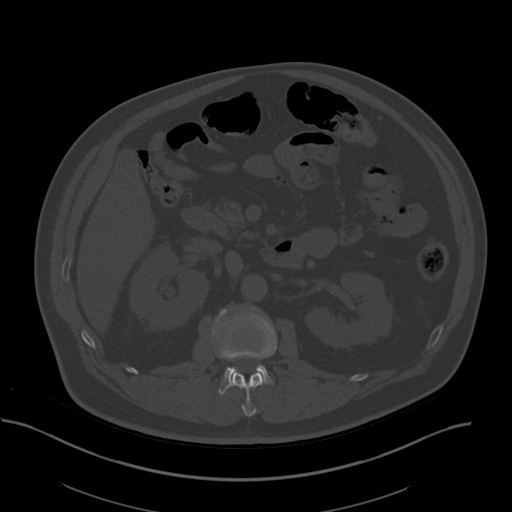
[im 73/103  soft-tissue]
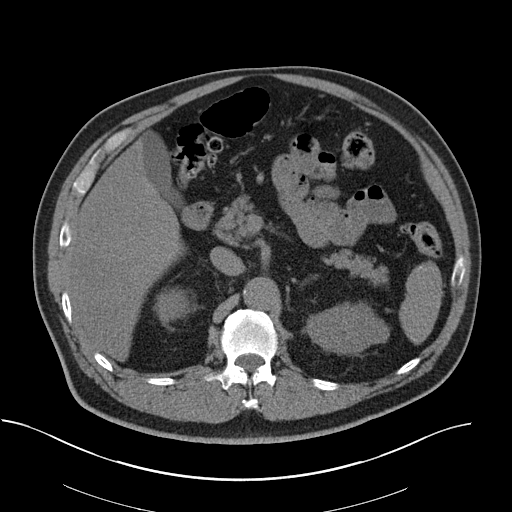
[im 79/103  soft-tissue]
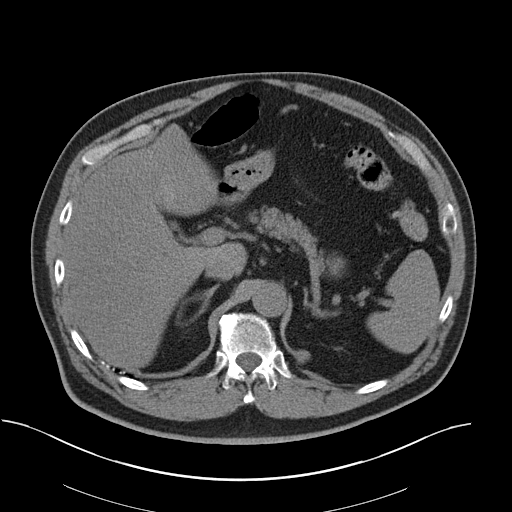
[im 91/103  soft-tissue]
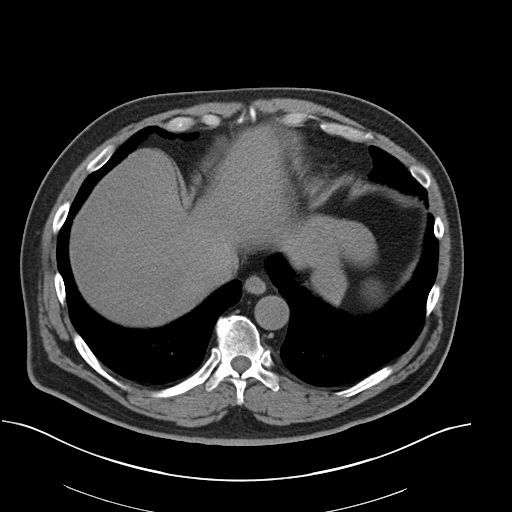
[im 97/103  soft-tissue]
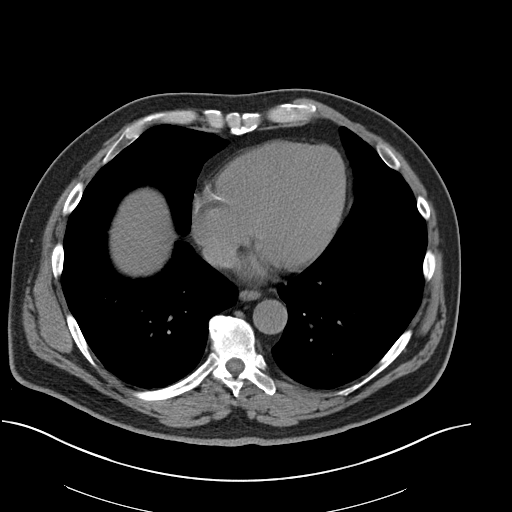

[Series 5: coronal · coronal · 0.90mm/px · 3 of 167 slices shown]
[im 56/167  soft-tissue]
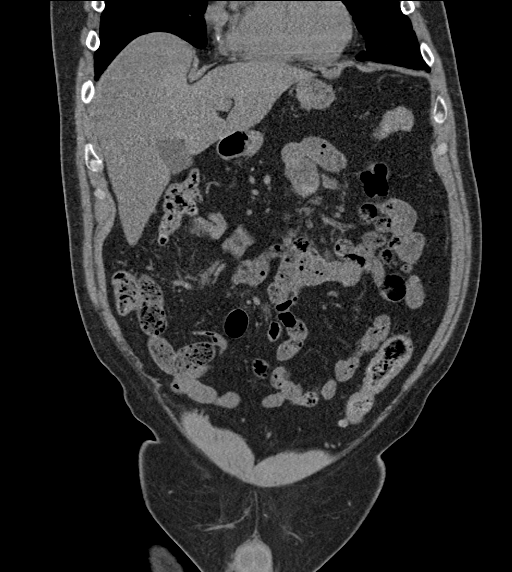
[im 74/167  soft-tissue]
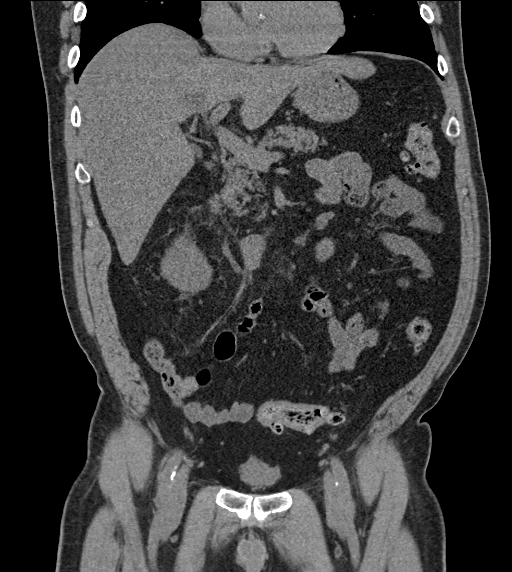
[im 93/167  soft-tissue]
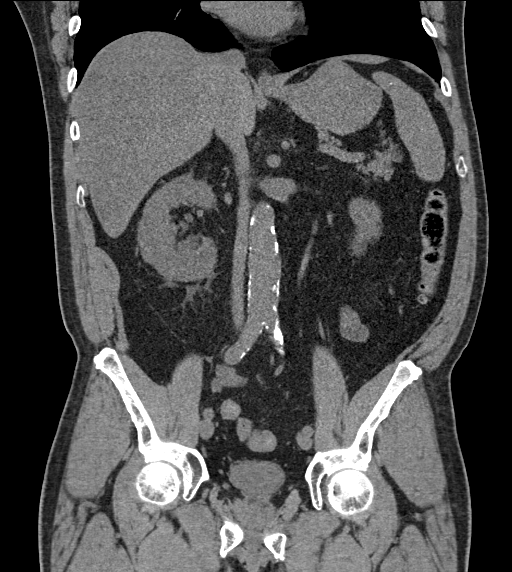

[16 of 46 positions shown; findings below may reference images not displayed]

FINDINGS: Lower chest: Mild bibasilar atelectatic changes present within the
lung bases. Visualized lungs are otherwise clear.

Hepatobiliary: Hepatic steatosis. Vague hypodensity within the
central aspect the liver may reflect a small cyst or possibly
hemangioma. Liver otherwise unremarkable. Gallbladder within normal
limits. No biliary dilatation.

Pancreas: Pancreas normal.

Spleen: Scattered calcified granulomas noted within the spleen.
Spleen otherwise unremarkable.

Adrenals/Urinary Tract: Adrenal glands are normal.

There is an obstructive 5 mm stone lodged within the proximal right
ureter with secondary mild to moderate right hydroureteronephrosis.
No other stones seen distally along the course of the right ureter.
Additional 4 mm nonobstructive right renal calculus noted. 8 mm
nonobstructive left renal calculus. No left-sided hydronephrosis or
hydroureter. Bladder decompressed without acute abnormality. No
layering stones within the bladder lumen.

Stomach/Bowel: Stomach normal. No evidence for bowel obstruction.
Colonic diverticulosis without evidence for acute diverticulitis. No
acute inflammatory changes about the bowels.

Vascular/Lymphatic: Moderate aorto bi-iliac atherosclerotic disease.
Borderline aneurysmal dilatation of the intraabdominal aorta up to 3
cm. No adenopathy.

Reproductive: Prostate normal.

Other: No free air or fluid. Small fat containing paraumbilical
hernia noted.

Musculoskeletal: No acute osseous abnormality. No worrisome lytic or
blastic osseous lesions. Mild scoliosis with multilevel degenerative
spondylolysis noted within the visualized spine.
IMPRESSION: 1. 5 mm obstructive stone within the proximal right ureter with
secondary mild to moderate right hydroureteronephrosis.
2. Additional bilateral nonobstructive nephrolithiasis as above.
3. Colonic diverticulosis without evidence for acute diverticulitis.

## 2019-03-04 DIAGNOSIS — E669 Obesity, unspecified: Secondary | ICD-10-CM | POA: Diagnosis not present

## 2019-03-04 DIAGNOSIS — E119 Type 2 diabetes mellitus without complications: Secondary | ICD-10-CM | POA: Diagnosis not present

## 2019-03-04 DIAGNOSIS — I10 Essential (primary) hypertension: Secondary | ICD-10-CM | POA: Diagnosis not present

## 2019-03-04 DIAGNOSIS — Z79899 Other long term (current) drug therapy: Secondary | ICD-10-CM | POA: Diagnosis not present

## 2019-03-04 DIAGNOSIS — E782 Mixed hyperlipidemia: Secondary | ICD-10-CM | POA: Diagnosis not present

## 2019-03-04 DIAGNOSIS — I2510 Atherosclerotic heart disease of native coronary artery without angina pectoris: Secondary | ICD-10-CM | POA: Diagnosis not present

## 2019-03-04 DIAGNOSIS — E559 Vitamin D deficiency, unspecified: Secondary | ICD-10-CM | POA: Diagnosis not present

## 2019-04-08 DIAGNOSIS — H59813 Chorioretinal scars after surgery for detachment, bilateral: Secondary | ICD-10-CM | POA: Diagnosis not present

## 2019-04-08 DIAGNOSIS — H25813 Combined forms of age-related cataract, bilateral: Secondary | ICD-10-CM | POA: Diagnosis not present

## 2019-04-08 DIAGNOSIS — H33303 Unspecified retinal break, bilateral: Secondary | ICD-10-CM | POA: Diagnosis not present

## 2019-07-30 ENCOUNTER — Encounter (INDEPENDENT_AMBULATORY_CARE_PROVIDER_SITE_OTHER): Payer: Medicare Other | Admitting: Ophthalmology

## 2019-07-30 ENCOUNTER — Other Ambulatory Visit: Payer: Self-pay

## 2019-07-30 DIAGNOSIS — H43813 Vitreous degeneration, bilateral: Secondary | ICD-10-CM

## 2019-07-30 DIAGNOSIS — H2513 Age-related nuclear cataract, bilateral: Secondary | ICD-10-CM | POA: Diagnosis not present

## 2019-07-30 DIAGNOSIS — H33303 Unspecified retinal break, bilateral: Secondary | ICD-10-CM | POA: Diagnosis not present

## 2019-07-30 DIAGNOSIS — H35371 Puckering of macula, right eye: Secondary | ICD-10-CM | POA: Diagnosis not present

## 2019-08-19 DIAGNOSIS — H2513 Age-related nuclear cataract, bilateral: Secondary | ICD-10-CM | POA: Diagnosis not present

## 2019-08-19 DIAGNOSIS — H18413 Arcus senilis, bilateral: Secondary | ICD-10-CM | POA: Diagnosis not present

## 2019-08-19 DIAGNOSIS — H2511 Age-related nuclear cataract, right eye: Secondary | ICD-10-CM | POA: Diagnosis not present

## 2019-08-19 DIAGNOSIS — H25043 Posterior subcapsular polar age-related cataract, bilateral: Secondary | ICD-10-CM | POA: Diagnosis not present

## 2019-08-19 DIAGNOSIS — H25013 Cortical age-related cataract, bilateral: Secondary | ICD-10-CM | POA: Diagnosis not present

## 2019-09-29 DIAGNOSIS — M720 Palmar fascial fibromatosis [Dupuytren]: Secondary | ICD-10-CM | POA: Diagnosis not present

## 2019-09-29 DIAGNOSIS — M79642 Pain in left hand: Secondary | ICD-10-CM | POA: Diagnosis not present

## 2019-09-29 DIAGNOSIS — M545 Low back pain: Secondary | ICD-10-CM | POA: Diagnosis not present

## 2019-09-29 DIAGNOSIS — S68122D Partial traumatic metacarpophalangeal amputation of right middle finger, subsequent encounter: Secondary | ICD-10-CM | POA: Diagnosis not present

## 2019-09-29 DIAGNOSIS — M79641 Pain in right hand: Secondary | ICD-10-CM | POA: Diagnosis not present

## 2019-10-14 DIAGNOSIS — Z23 Encounter for immunization: Secondary | ICD-10-CM | POA: Diagnosis not present

## 2019-10-27 DIAGNOSIS — H2511 Age-related nuclear cataract, right eye: Secondary | ICD-10-CM | POA: Diagnosis not present

## 2019-10-27 DIAGNOSIS — Z961 Presence of intraocular lens: Secondary | ICD-10-CM | POA: Diagnosis not present

## 2019-10-28 DIAGNOSIS — H2512 Age-related nuclear cataract, left eye: Secondary | ICD-10-CM | POA: Diagnosis not present

## 2019-11-10 DIAGNOSIS — H2512 Age-related nuclear cataract, left eye: Secondary | ICD-10-CM | POA: Diagnosis not present

## 2019-11-19 DIAGNOSIS — H53002 Unspecified amblyopia, left eye: Secondary | ICD-10-CM | POA: Diagnosis not present

## 2019-11-19 DIAGNOSIS — H5203 Hypermetropia, bilateral: Secondary | ICD-10-CM | POA: Diagnosis not present

## 2019-11-19 DIAGNOSIS — H52223 Regular astigmatism, bilateral: Secondary | ICD-10-CM | POA: Diagnosis not present

## 2019-11-19 DIAGNOSIS — Z961 Presence of intraocular lens: Secondary | ICD-10-CM | POA: Diagnosis not present

## 2019-11-19 DIAGNOSIS — H2512 Age-related nuclear cataract, left eye: Secondary | ICD-10-CM | POA: Diagnosis not present

## 2019-12-09 DIAGNOSIS — E559 Vitamin D deficiency, unspecified: Secondary | ICD-10-CM | POA: Diagnosis not present

## 2019-12-09 DIAGNOSIS — Z7984 Long term (current) use of oral hypoglycemic drugs: Secondary | ICD-10-CM | POA: Diagnosis not present

## 2019-12-09 DIAGNOSIS — I2510 Atherosclerotic heart disease of native coronary artery without angina pectoris: Secondary | ICD-10-CM | POA: Diagnosis not present

## 2019-12-09 DIAGNOSIS — E669 Obesity, unspecified: Secondary | ICD-10-CM | POA: Diagnosis not present

## 2019-12-09 DIAGNOSIS — Z1211 Encounter for screening for malignant neoplasm of colon: Secondary | ICD-10-CM | POA: Diagnosis not present

## 2019-12-09 DIAGNOSIS — E782 Mixed hyperlipidemia: Secondary | ICD-10-CM | POA: Diagnosis not present

## 2019-12-09 DIAGNOSIS — Z0001 Encounter for general adult medical examination with abnormal findings: Secondary | ICD-10-CM | POA: Diagnosis not present

## 2019-12-09 DIAGNOSIS — I10 Essential (primary) hypertension: Secondary | ICD-10-CM | POA: Diagnosis not present

## 2019-12-09 DIAGNOSIS — E119 Type 2 diabetes mellitus without complications: Secondary | ICD-10-CM | POA: Diagnosis not present

## 2019-12-09 DIAGNOSIS — Z79899 Other long term (current) drug therapy: Secondary | ICD-10-CM | POA: Diagnosis not present

## 2019-12-09 DIAGNOSIS — Z125 Encounter for screening for malignant neoplasm of prostate: Secondary | ICD-10-CM | POA: Diagnosis not present

## 2019-12-23 ENCOUNTER — Encounter: Payer: Medicare Other | Attending: Internal Medicine | Admitting: Dietician

## 2019-12-23 ENCOUNTER — Encounter: Payer: Self-pay | Admitting: Dietician

## 2019-12-23 ENCOUNTER — Other Ambulatory Visit: Payer: Self-pay

## 2019-12-23 DIAGNOSIS — I10 Essential (primary) hypertension: Secondary | ICD-10-CM | POA: Diagnosis not present

## 2019-12-23 DIAGNOSIS — E119 Type 2 diabetes mellitus without complications: Secondary | ICD-10-CM | POA: Diagnosis not present

## 2019-12-23 NOTE — Patient Instructions (Addendum)
Have 4 carb choices (60 g) per meal.  Begin building your breakfast with 4 carb choices like we discussed. Once you get comfortable with that, move to lunch, and then eventually dinner.  Continue being physically active! Great job with the walking and fishing!  Look into signing up for a yoga class!

## 2019-12-23 NOTE — Progress Notes (Signed)
Diabetes Self-Management Education  Visit Type: First/Initial  Appt. Start Time: 1410 Appt. End Time: 8341  12/23/2019  Frank Walsh, identified by name and date of birth, is a 69 y.o. male with a diagnosis of Diabetes: Type 2.   ASSESSMENT  Pt is single, lives alone, doesn't like to cook. Pt reports that he knows the consequences of uncontrolled diabetes, but still hasn't been able to get motivated to change. Pt is a retired Pharmacist, community. Pt loves to fish, has boat/house on [place]. Wants to be able to travel during his retirement. Pt walks an hour a day, 5 days a week. Pt was checking his blood sugar previously, but has stopped. States that he was having issues being able to get good sample to test. Pt states he will begin to check his fasting value.  Breakfast rotation - cereal and fruit, Eggos and SF syrup, or eggs and bacon Lunch - Usually a piece of fruit, or eats out ([employer], [employer]) Dinner- Usually eats out, tries to eat [employer] ~ 64 oz Pt is a 3 meals a day  There were no vitals taken for this visit. There is no height or weight on file to calculate BMI.   Diabetes Self-Management Education - 12/23/19 1432      Visit Information   Visit Type First/Initial      Initial Visit   Diabetes Type Type 2    Are you currently following a meal plan? No    Are you taking your medications as prescribed? Yes    Date Diagnosed 2018      Health Coping   How would you rate your overall health? Good      Psychosocial Assessment   Patient Belief/Attitude about Diabetes Motivated to manage diabetes    Self-care barriers None    Self-management support Doctor's office    Other persons present Patient    Patient Concerns Nutrition/Meal planning;Monitoring    Special Needs None    Preferred Learning Style No preference indicated    Learning Readiness Not Ready    How often do you need to have someone help you when you read instructions, pamphlets, or other  written materials from your doctor or pharmacy? 1 - Never    What is the last grade level you completed in school? Dental degree      Pre-Education Assessment   Patient understands the diabetes disease and treatment process. Needs Review    Patient understands incorporating nutritional management into lifestyle. Needs Instruction    Patient undertands incorporating physical activity into lifestyle. Needs Review    Patient understands using medications safely. Needs Review    Patient understands monitoring blood glucose, interpreting and using results Needs Instruction    Patient understands prevention, detection, and treatment of acute complications. Needs Instruction    Patient understands prevention, detection, and treatment of chronic complications. Needs Instruction    Patient understands how to develop strategies to address psychosocial issues. Needs Instruction    Patient understands how to develop strategies to promote health/change behavior. Needs Instruction      Complications   Last HgB A1C per patient/outside source 9.5 %    How often do you check your blood sugar? 0 times/day (not testing)    Have you had a dilated eye exam in the past 12 months? Yes    Have you had a dental exam in the past 12 months? Yes    Are you checking your feet? No      Dietary  Intake   Breakfast Rice Krispies, cinnamon toast crunch, skim milk, 1 sliced banana, handful of sliced strawberries    Snack (morning) None    Lunch Pimento cheese sandwich, white bread, pickles, water    Snack (afternoon) none    Dinner 2 [employer] chicken sandwiches, water to drink    Snack (evening) none    Beverage(s) Water      Exercise   Exercise Type ADL's;Light (walking / raking leaves)    How many days per week to you exercise? 5    How many minutes per day do you exercise? 60    Total minutes per week of exercise 300      Patient Education   Previous Diabetes Education No    Disease state  Definition of  diabetes, type 1 and 2, and the diagnosis of diabetes;Explored patient's options for treatment of their diabetes    Nutrition management  Carbohydrate counting;Reviewed blood glucose goals for pre and post meals and how to evaluate the patients' food intake on their blood glucose level.;Information on hints to eating out and maintain blood glucose control.    Physical activity and exercise  Role of exercise on diabetes management, blood pressure control and cardiac health.    Medications Reviewed patients medication for diabetes, action, purpose, timing of dose and side effects.    Chronic complications Relationship between chronic complications and blood glucose control;Identified and discussed with patient  current chronic complications;Dental care;Retinopathy and reason for yearly dilated eye exams    Psychosocial adjustment Role of stress on diabetes      Individualized Goals (developed by patient)   Nutrition Follow meal plan discussed;General guidelines for healthy choices and portions discussed    Physical Activity Exercise 5-7 days per week    Medications take my medication as prescribed    Monitoring  Not Applicable      Post-Education Assessment   Patient understands the diabetes disease and treatment process. Needs Review    Patient understands incorporating nutritional management into lifestyle. Needs Review    Patient undertands incorporating physical activity into lifestyle. Needs Review    Patient understands using medications safely. Needs Review    Patient understands monitoring blood glucose, interpreting and using results Needs Review    Patient understands prevention, detection, and treatment of acute complications. Needs Review    Patient understands prevention, detection, and treatment of chronic complications. Needs Review    Patient understands how to develop strategies to address psychosocial issues. Needs Review    Patient understands how to develop strategies to  promote health/change behavior. Needs Review      Outcomes   Expected Outcomes Demonstrated interest in learning. Expect positive outcomes    Future DMSE 4-6 wks           Individualized Plan for Diabetes Self-Management Training:   Learning Objective:  Patient will have a greater understanding of diabetes self-management. Patient education plan is to attend individual and/or group sessions per assessed needs and concerns.   Plan:   Patient Instructions  Have 4 carb choices (60 g) per meal.  Begin building your breakfast with 4 carb choices like we discussed. Once you get comfortable with that, move to lunch, and then eventually dinner.  Continue being physically active! Great job with the walking and fishing!  Look into signing up for a yoga class!  Check your fasting BG daily!  Expected Outcomes:  Demonstrated interest in learning. Expect positive outcomes  Education material provided: ADA - How to Thrive: A  Guide for Your Journey with Diabetes, Meal plan card and My Plate  If problems or questions, patient to contact team via:  Phone and Email  Future DSME appointment: 4-6 wks

## 2020-01-27 ENCOUNTER — Ambulatory Visit: Payer: Medicare Other | Admitting: Dietician

## 2020-02-01 DIAGNOSIS — Z03818 Encounter for observation for suspected exposure to other biological agents ruled out: Secondary | ICD-10-CM | POA: Diagnosis not present

## 2020-02-01 DIAGNOSIS — Z20822 Contact with and (suspected) exposure to covid-19: Secondary | ICD-10-CM | POA: Diagnosis not present

## 2020-02-02 DIAGNOSIS — Z20822 Contact with and (suspected) exposure to covid-19: Secondary | ICD-10-CM | POA: Diagnosis not present

## 2020-02-03 ENCOUNTER — Ambulatory Visit: Payer: Medicare Other | Admitting: Dietician

## 2020-02-10 ENCOUNTER — Encounter: Payer: Self-pay | Admitting: Dietician

## 2020-02-10 ENCOUNTER — Other Ambulatory Visit: Payer: Self-pay

## 2020-02-10 ENCOUNTER — Encounter: Payer: Medicare Other | Attending: Internal Medicine | Admitting: Dietician

## 2020-02-10 VITALS — Ht 73.0 in | Wt 221.5 lb

## 2020-02-10 DIAGNOSIS — E119 Type 2 diabetes mellitus without complications: Secondary | ICD-10-CM | POA: Diagnosis not present

## 2020-02-10 NOTE — Patient Instructions (Addendum)
Continue to eat your carbs consistently throughout the day at [employer] meal.  If you feel more comfortable at 3 carb choices (45g), you can move down to that. If not, try to keep hitting 60g at each meal.  Check your fasting blood sugar each morning, and 2 hours after you begin eating a meal.   Look for values under 125 fasting  Below 180 after a meal.  Continue to do your hour walks as the weather allows.  Have your A1c taken in the next 6 weeks.

## 2020-02-10 NOTE — Progress Notes (Signed)
Diabetes Self-Management Education  Visit Type: Follow-up  Appt. Start Time: 1130 Appt. End Time: 1200  02/10/2020  [name], identified by name and date of birth, is a 70 y.o. male with a diagnosis of Diabetes: Type 2.   ASSESSMENT  Pt reports losing between 8-10 lbs since our last visit. Pt states that it hasn't been as difficult as he thought. Pt reports just coming back from a fishing trip in [place], states he gained a little bit of weight but didn't have any guilt while eating. Pt states he has been doing very well counting carbs and never goes over 60g at a meal. Pt states he is getting an average of around 40 g per meal, but will have meals that are very low in carbs. Pt states he has been checking his fasting BG regularly. States his average FBG would be around 140-150. Lowest of 123, highest of 186. Pt reports consistency is difficult at times. Pt expects to see big improvements in 6 months with his A1c, BG, and weight. States he wants to lose 8-10 pounds per month until he reaches his goal weight of under 200 lbs  Height 6\' 1"  (1.854 m), weight 221 lb 8 oz (100.5 kg). Body mass index is 29.22 kg/m.   Diabetes Self-Management Education - 02/10/20 1140      Visit Information   Visit Type Follow-up      Initial Visit   Diabetes Type Type 2    Are you currently following a meal plan? Yes      Dietary Intake   Breakfast Bacon eggs handfull of cantaloupe, water    Lunch Handful of cashews    Dinner Chopped BBQ with white slaw, unsweetened    Beverage(s) 80 oz water, unsweetend tea      Exercise   Exercise Type ADL's;Light (walking / raking leaves)    How many days per week to you exercise? 5    How many minutes per day do you exercise? 60    Total minutes per week of exercise 300           Individualized Plan for Diabetes Self-Management Training:   Learning Objective:  Patient will have a greater understanding of diabetes self-management. Patient  education plan is to attend individual and/or group sessions per assessed needs and concerns.   Plan:   Patient Instructions  Continue to eat your carbs consistently throughout the day at [facility] meal.  If you feel more comfortable at 3 carb choices (45g), you can move down to that. If not, try to keep hitting 60g at each meal.  Check your fasting blood sugar each morning, and 2 hours after you begin eating a meal.   Look for values under 125 fasting  Below 180 after a meal.  Continue to do your hour walks as the weather allows.  Have your A1c taken in the next 6 weeks.  If problems or questions, patient to contact team via:  Phone and Email  Future DSME appointment:  2 months

## 2020-03-09 DIAGNOSIS — E119 Type 2 diabetes mellitus without complications: Secondary | ICD-10-CM | POA: Diagnosis not present

## 2020-03-09 DIAGNOSIS — Z7984 Long term (current) use of oral hypoglycemic drugs: Secondary | ICD-10-CM | POA: Diagnosis not present

## 2020-03-09 DIAGNOSIS — E559 Vitamin D deficiency, unspecified: Secondary | ICD-10-CM | POA: Diagnosis not present

## 2020-03-09 DIAGNOSIS — E782 Mixed hyperlipidemia: Secondary | ICD-10-CM | POA: Diagnosis not present

## 2020-03-09 DIAGNOSIS — R946 Abnormal results of thyroid function studies: Secondary | ICD-10-CM | POA: Diagnosis not present

## 2020-04-13 ENCOUNTER — Encounter: Payer: Self-pay | Admitting: Dietician

## 2020-04-13 ENCOUNTER — Other Ambulatory Visit: Payer: Self-pay

## 2020-04-13 ENCOUNTER — Encounter: Payer: Medicare Other | Attending: Internal Medicine | Admitting: Dietician

## 2020-04-13 DIAGNOSIS — E119 Type 2 diabetes mellitus without complications: Secondary | ICD-10-CM | POA: Diagnosis not present

## 2020-04-13 NOTE — Patient Instructions (Addendum)
Aim for around 45g of carbs (3 carb choices) at each meal instead of 60g.

## 2020-04-13 NOTE — Progress Notes (Signed)
Diabetes Self-Management Education  Visit Type: Follow-up  Appt. Start Time: 1145 Appt. End Time: 1215  04/13/2020  [name], identified by name and date of birth, is a 70 y.o. male with a diagnosis of Diabetes: Type 2.   ASSESSMENT  Pt reports their A1c has dropped from 9.5 to 6.6 as of 03/09/2020. Pt states that seeing positive results has helped to keep them motivated. Pt reports doing well, states their BG numbers have improved.  Pt self reports weight at 208 from home scale this morning (Declined to be weighed in office today), started at over 220 lbs. Pt reports only checking FBG once a week. Last 4 weeks: 128,117,122,136. Pt attributes success to not eating fast-food anymore and only having keto bread instead of regular bread. Pt reports eating more veggies and a variety of fruits. Makes chicken stir-fry with broccoli. Pt reports walking for about an hour, 5 days a week. Pt reports checking their BP at home, averaging ~132/78.   Recall: 3 eggs, 3 pieces of bacon, pineapple, melon, strawberries 6 oz chicken salad  7 oz meatloaf, small can of green beans, small can of corn    Individualized Plan for Diabetes Self-Management Training:   Learning Objective:  Patient will have a greater understanding of diabetes self-management. Patient education plan is to attend individual and/or group sessions per assessed needs and concerns.   Plan:   Patient Instructions   Aim for around 45g of carbs (3 carb choices) at each meal instead of 60g.  Continue to have your A1c checked every 3 months.  If problems or questions, patient to contact team via:  Phone and Email  Future DSME appointment:  PRN

## 2020-07-29 ENCOUNTER — Other Ambulatory Visit: Payer: Self-pay

## 2020-07-29 ENCOUNTER — Encounter (INDEPENDENT_AMBULATORY_CARE_PROVIDER_SITE_OTHER): Payer: Medicare Other | Admitting: Ophthalmology

## 2020-07-29 DIAGNOSIS — H33303 Unspecified retinal break, bilateral: Secondary | ICD-10-CM

## 2020-07-29 DIAGNOSIS — H35371 Puckering of macula, right eye: Secondary | ICD-10-CM

## 2020-07-29 DIAGNOSIS — H43813 Vitreous degeneration, bilateral: Secondary | ICD-10-CM | POA: Diagnosis not present

## 2020-08-04 ENCOUNTER — Encounter (INDEPENDENT_AMBULATORY_CARE_PROVIDER_SITE_OTHER): Payer: Medicare Other | Admitting: Ophthalmology

## 2020-10-27 DIAGNOSIS — Z23 Encounter for immunization: Secondary | ICD-10-CM | POA: Diagnosis not present

## 2020-12-28 DIAGNOSIS — E119 Type 2 diabetes mellitus without complications: Secondary | ICD-10-CM | POA: Diagnosis not present

## 2020-12-28 DIAGNOSIS — E559 Vitamin D deficiency, unspecified: Secondary | ICD-10-CM | POA: Diagnosis not present

## 2020-12-28 DIAGNOSIS — Z7984 Long term (current) use of oral hypoglycemic drugs: Secondary | ICD-10-CM | POA: Diagnosis not present

## 2020-12-28 DIAGNOSIS — Z79899 Other long term (current) drug therapy: Secondary | ICD-10-CM | POA: Diagnosis not present

## 2020-12-28 DIAGNOSIS — E669 Obesity, unspecified: Secondary | ICD-10-CM | POA: Diagnosis not present

## 2020-12-28 DIAGNOSIS — Z125 Encounter for screening for malignant neoplasm of prostate: Secondary | ICD-10-CM | POA: Diagnosis not present

## 2020-12-28 DIAGNOSIS — I10 Essential (primary) hypertension: Secondary | ICD-10-CM | POA: Diagnosis not present

## 2020-12-28 DIAGNOSIS — Z0001 Encounter for general adult medical examination with abnormal findings: Secondary | ICD-10-CM | POA: Diagnosis not present

## 2020-12-28 DIAGNOSIS — Z1211 Encounter for screening for malignant neoplasm of colon: Secondary | ICD-10-CM | POA: Diagnosis not present

## 2020-12-28 DIAGNOSIS — E782 Mixed hyperlipidemia: Secondary | ICD-10-CM | POA: Diagnosis not present

## 2020-12-28 DIAGNOSIS — I2510 Atherosclerotic heart disease of native coronary artery without angina pectoris: Secondary | ICD-10-CM | POA: Diagnosis not present

## 2021-01-17 DIAGNOSIS — Z1212 Encounter for screening for malignant neoplasm of rectum: Secondary | ICD-10-CM | POA: Diagnosis not present

## 2021-01-17 DIAGNOSIS — Z1211 Encounter for screening for malignant neoplasm of colon: Secondary | ICD-10-CM | POA: Diagnosis not present

## 2021-01-23 LAB — COLOGUARD: COLOGUARD: NEGATIVE

## 2021-03-22 DIAGNOSIS — H524 Presbyopia: Secondary | ICD-10-CM | POA: Diagnosis not present

## 2021-03-22 DIAGNOSIS — H2513 Age-related nuclear cataract, bilateral: Secondary | ICD-10-CM | POA: Diagnosis not present

## 2021-03-22 DIAGNOSIS — H33303 Unspecified retinal break, bilateral: Secondary | ICD-10-CM | POA: Diagnosis not present

## 2021-03-22 DIAGNOSIS — H52223 Regular astigmatism, bilateral: Secondary | ICD-10-CM | POA: Diagnosis not present

## 2021-03-22 DIAGNOSIS — H59813 Chorioretinal scars after surgery for detachment, bilateral: Secondary | ICD-10-CM | POA: Diagnosis not present

## 2021-03-22 DIAGNOSIS — H5203 Hypermetropia, bilateral: Secondary | ICD-10-CM | POA: Diagnosis not present

## 2021-03-22 DIAGNOSIS — H25813 Combined forms of age-related cataract, bilateral: Secondary | ICD-10-CM | POA: Diagnosis not present

## 2021-03-31 DIAGNOSIS — L720 Epidermal cyst: Secondary | ICD-10-CM | POA: Diagnosis not present

## 2021-04-14 DIAGNOSIS — M5442 Lumbago with sciatica, left side: Secondary | ICD-10-CM | POA: Diagnosis not present

## 2021-04-14 DIAGNOSIS — Z6830 Body mass index (BMI) 30.0-30.9, adult: Secondary | ICD-10-CM | POA: Diagnosis not present

## 2021-04-14 DIAGNOSIS — M5136 Other intervertebral disc degeneration, lumbar region: Secondary | ICD-10-CM | POA: Diagnosis not present

## 2021-04-14 DIAGNOSIS — I10 Essential (primary) hypertension: Secondary | ICD-10-CM | POA: Diagnosis not present

## 2021-04-14 DIAGNOSIS — M4150 Other secondary scoliosis, site unspecified: Secondary | ICD-10-CM | POA: Diagnosis not present

## 2021-07-25 DIAGNOSIS — E559 Vitamin D deficiency, unspecified: Secondary | ICD-10-CM | POA: Diagnosis not present

## 2021-07-25 DIAGNOSIS — I2510 Atherosclerotic heart disease of native coronary artery without angina pectoris: Secondary | ICD-10-CM | POA: Diagnosis not present

## 2021-07-25 DIAGNOSIS — E782 Mixed hyperlipidemia: Secondary | ICD-10-CM | POA: Diagnosis not present

## 2021-07-25 DIAGNOSIS — I10 Essential (primary) hypertension: Secondary | ICD-10-CM | POA: Diagnosis not present

## 2021-07-25 DIAGNOSIS — E119 Type 2 diabetes mellitus without complications: Secondary | ICD-10-CM | POA: Diagnosis not present

## 2021-07-25 DIAGNOSIS — Z79899 Other long term (current) drug therapy: Secondary | ICD-10-CM | POA: Diagnosis not present

## 2021-07-25 DIAGNOSIS — E669 Obesity, unspecified: Secondary | ICD-10-CM | POA: Diagnosis not present

## 2021-07-25 DIAGNOSIS — Z1211 Encounter for screening for malignant neoplasm of colon: Secondary | ICD-10-CM | POA: Diagnosis not present

## 2021-08-03 ENCOUNTER — Encounter (INDEPENDENT_AMBULATORY_CARE_PROVIDER_SITE_OTHER): Payer: Medicare Other | Admitting: Ophthalmology

## 2021-08-03 DIAGNOSIS — H35371 Puckering of macula, right eye: Secondary | ICD-10-CM | POA: Diagnosis not present

## 2021-08-03 DIAGNOSIS — H43813 Vitreous degeneration, bilateral: Secondary | ICD-10-CM | POA: Diagnosis not present

## 2021-08-03 DIAGNOSIS — H33303 Unspecified retinal break, bilateral: Secondary | ICD-10-CM

## 2021-10-11 DIAGNOSIS — Z23 Encounter for immunization: Secondary | ICD-10-CM | POA: Diagnosis not present

## 2022-02-14 DIAGNOSIS — Z125 Encounter for screening for malignant neoplasm of prostate: Secondary | ICD-10-CM | POA: Diagnosis not present

## 2022-02-14 DIAGNOSIS — Z23 Encounter for immunization: Secondary | ICD-10-CM | POA: Diagnosis not present

## 2022-02-14 DIAGNOSIS — Z0000 Encounter for general adult medical examination without abnormal findings: Secondary | ICD-10-CM | POA: Diagnosis not present

## 2022-02-14 DIAGNOSIS — I10 Essential (primary) hypertension: Secondary | ICD-10-CM | POA: Diagnosis not present

## 2022-02-14 DIAGNOSIS — E669 Obesity, unspecified: Secondary | ICD-10-CM | POA: Diagnosis not present

## 2022-02-14 DIAGNOSIS — E782 Mixed hyperlipidemia: Secondary | ICD-10-CM | POA: Diagnosis not present

## 2022-02-14 DIAGNOSIS — I2510 Atherosclerotic heart disease of native coronary artery without angina pectoris: Secondary | ICD-10-CM | POA: Diagnosis not present

## 2022-02-14 DIAGNOSIS — Z79899 Other long term (current) drug therapy: Secondary | ICD-10-CM | POA: Diagnosis not present

## 2022-02-14 DIAGNOSIS — E119 Type 2 diabetes mellitus without complications: Secondary | ICD-10-CM | POA: Diagnosis not present

## 2022-02-14 DIAGNOSIS — E559 Vitamin D deficiency, unspecified: Secondary | ICD-10-CM | POA: Diagnosis not present

## 2022-02-22 DIAGNOSIS — Z125 Encounter for screening for malignant neoplasm of prostate: Secondary | ICD-10-CM | POA: Diagnosis not present

## 2022-04-04 DIAGNOSIS — L570 Actinic keratosis: Secondary | ICD-10-CM | POA: Diagnosis not present

## 2022-04-04 DIAGNOSIS — L111 Transient acantholytic dermatosis [Grover]: Secondary | ICD-10-CM | POA: Diagnosis not present

## 2022-04-04 DIAGNOSIS — D225 Melanocytic nevi of trunk: Secondary | ICD-10-CM | POA: Diagnosis not present

## 2022-04-04 DIAGNOSIS — Z85828 Personal history of other malignant neoplasm of skin: Secondary | ICD-10-CM | POA: Diagnosis not present

## 2022-04-04 DIAGNOSIS — C44719 Basal cell carcinoma of skin of left lower limb, including hip: Secondary | ICD-10-CM | POA: Diagnosis not present

## 2022-04-04 DIAGNOSIS — L905 Scar conditions and fibrosis of skin: Secondary | ICD-10-CM | POA: Diagnosis not present

## 2022-04-04 DIAGNOSIS — D485 Neoplasm of uncertain behavior of skin: Secondary | ICD-10-CM | POA: Diagnosis not present

## 2022-07-03 DIAGNOSIS — I10 Essential (primary) hypertension: Secondary | ICD-10-CM | POA: Diagnosis not present

## 2022-07-03 DIAGNOSIS — E119 Type 2 diabetes mellitus without complications: Secondary | ICD-10-CM | POA: Diagnosis not present

## 2022-07-03 DIAGNOSIS — E559 Vitamin D deficiency, unspecified: Secondary | ICD-10-CM | POA: Diagnosis not present

## 2022-07-03 DIAGNOSIS — R0789 Other chest pain: Secondary | ICD-10-CM | POA: Diagnosis not present

## 2022-07-03 DIAGNOSIS — I2510 Atherosclerotic heart disease of native coronary artery without angina pectoris: Secondary | ICD-10-CM | POA: Diagnosis not present

## 2022-07-03 DIAGNOSIS — R5383 Other fatigue: Secondary | ICD-10-CM | POA: Diagnosis not present

## 2022-07-05 DIAGNOSIS — D649 Anemia, unspecified: Secondary | ICD-10-CM | POA: Diagnosis not present

## 2022-07-07 ENCOUNTER — Other Ambulatory Visit (HOSPITAL_COMMUNITY): Payer: Self-pay | Admitting: Internal Medicine

## 2022-07-07 DIAGNOSIS — I2510 Atherosclerotic heart disease of native coronary artery without angina pectoris: Secondary | ICD-10-CM

## 2022-07-25 ENCOUNTER — Ambulatory Visit (HOSPITAL_COMMUNITY)
Admission: RE | Admit: 2022-07-25 | Discharge: 2022-07-25 | Disposition: A | Discharge status: Home / Self Care | Payer: Medicare Other | Source: Ambulatory Visit | Attending: Internal Medicine | Admitting: Internal Medicine

## 2022-07-25 DIAGNOSIS — I2510 Atherosclerotic heart disease of native coronary artery without angina pectoris: Secondary | ICD-10-CM | POA: Insufficient documentation

## 2022-07-27 DIAGNOSIS — R195 Other fecal abnormalities: Secondary | ICD-10-CM | POA: Diagnosis not present

## 2022-07-27 DIAGNOSIS — E119 Type 2 diabetes mellitus without complications: Secondary | ICD-10-CM | POA: Diagnosis not present

## 2022-07-27 DIAGNOSIS — R0789 Other chest pain: Secondary | ICD-10-CM | POA: Diagnosis not present

## 2022-07-27 DIAGNOSIS — I10 Essential (primary) hypertension: Secondary | ICD-10-CM | POA: Diagnosis not present

## 2022-07-27 DIAGNOSIS — R931 Abnormal findings on diagnostic imaging of heart and coronary circulation: Secondary | ICD-10-CM | POA: Diagnosis not present

## 2022-07-27 DIAGNOSIS — D649 Anemia, unspecified: Secondary | ICD-10-CM | POA: Diagnosis not present

## 2022-08-01 ENCOUNTER — Encounter (INDEPENDENT_AMBULATORY_CARE_PROVIDER_SITE_OTHER): Payer: Medicare Other | Admitting: Ophthalmology

## 2022-08-21 DIAGNOSIS — D649 Anemia, unspecified: Secondary | ICD-10-CM | POA: Diagnosis not present

## 2022-08-21 DIAGNOSIS — R195 Other fecal abnormalities: Secondary | ICD-10-CM | POA: Diagnosis not present

## 2022-09-12 DIAGNOSIS — M79641 Pain in right hand: Secondary | ICD-10-CM | POA: Diagnosis not present

## 2022-09-12 DIAGNOSIS — S62356A Nondisplaced fracture of shaft of fifth metacarpal bone, right hand, initial encounter for closed fracture: Secondary | ICD-10-CM | POA: Diagnosis not present

## 2022-09-19 DIAGNOSIS — S62356A Nondisplaced fracture of shaft of fifth metacarpal bone, right hand, initial encounter for closed fracture: Secondary | ICD-10-CM | POA: Diagnosis not present

## 2022-09-27 DIAGNOSIS — D649 Anemia, unspecified: Secondary | ICD-10-CM | POA: Diagnosis not present

## 2022-09-27 DIAGNOSIS — R931 Abnormal findings on diagnostic imaging of heart and coronary circulation: Secondary | ICD-10-CM | POA: Diagnosis not present

## 2022-09-27 DIAGNOSIS — E119 Type 2 diabetes mellitus without complications: Secondary | ICD-10-CM | POA: Diagnosis not present

## 2022-09-27 DIAGNOSIS — R195 Other fecal abnormalities: Secondary | ICD-10-CM | POA: Diagnosis not present

## 2022-09-27 DIAGNOSIS — R0789 Other chest pain: Secondary | ICD-10-CM | POA: Diagnosis not present

## 2022-09-27 DIAGNOSIS — I10 Essential (primary) hypertension: Secondary | ICD-10-CM | POA: Diagnosis not present

## 2022-09-27 NOTE — Progress Notes (Unsigned)
Cardiology Office Note   Date:  09/28/2022   ID:  Frank Walsh, DOB 11-Sep-1950, MRN [id number]  PCP:  Frank Aspen, MD  Cardiologist:   Frank Rotunda, MD Referring:  Frank Aspen, MD  Chief Complaint  Patient presents with   Chest Pain      History of Present Illness: Frank Walsh is a 72 y.o. male who presents for evaluation of evaluation of chest pain.  He said he had chest discomfort about 4 to 5 months ago.   saw him in 2016 for evaluation of significant cardiovascular risk factors.  He had an equivocal stress test.  Coronary ostium was 648 which was 85th percentile.  Cardiac cath at that time demonstrated 40% proximal LAD stenosis and diffuse disease elsewhere with no high-grade stenosis.  He was managed medically.    Coronary calcium this summer was 2392 which was 93rd percentile.   He said that he is not particularly active.  He likes to fish a lot.   He said about 4 to 5 months ago he had some mid chest discomfort.  He could not really quantify or qualify this.  It happened at rest.  He is not describing any new shortness of breath, PND or orthopnea.  He had no new palpitations, presyncope or syncope.  Has had no weight gain or edema.    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   Thrombocytopenia, unspecified (HCC)     Past Surgical History:  Procedure Laterality Date   CARDIAC CATHETERIZATION N/A 02/26/2015   Procedure: Left Heart Cath and Coronary Angiography;  Surgeon: Corky Crafts, MD;  [facility] INVASIVE CV LAB;  Service: Cardiovascular;  Laterality: N/A;    CYSTOSCOPY/URETEROSCOPY/HOLMIUM LASER Left 05/28/2017   Procedure: CYSTOSCOPY/URETEROSCOPY/HOLMIUM LASER;  Surgeon: Crista Elliot, MD;  Location: [facility] ORS;  Service: Urology;  Laterality: Left;  With STENT   DUPUYTREN / PALMAR FASCIOTOMY     EXTRACORPOREAL SHOCK WAVE LITHOTRIPSY Left 05/21/2017   Procedure: LEFT EXTRACORPOREAL SHOCK WAVE LITHOTRIPSY (ESWL);  Surgeon: Marcine Matar, MD;  Location: [facility] OR[facility];  Service: Urology;  Laterality: Left;   LN biopsy       Current Outpatient Medications  Medication Sig Dispense Refill   Ascorbic Acid 500 MG CAPS 1,000 mg daily.     aspirin 81 MG tablet Take 81 mg by mouth daily.     atorvastatin (LIPITOR) 10 MG tablet Take 10 mg by mouth daily.     magnesium citrate SOLN Take 1 Bottle by mouth once.     metFORMIN (GLUCOPHAGE) 500 MG tablet Take 1 tablet (500 mg total) by mouth daily.  1   metoprolol tartrate (LOPRESSOR) 50 MG tablet Take 1 tablet (50 mg total) by mouth once for 1 dose. TAKE TWO HOURS PRIOR TO  SCHEDULE CARDIAC TEST 1 tablet 0   Multiple Vitamins-Minerals (MULTIVITAMIN PO) Take 1 tablet by mouth daily.      OZEMPIC, 1 MG/DOSE, 4 MG/3ML SOPN Inject 1 mg into the  skin once a week.     Probiotic TBEC daily.     valsartan (DIOVAN) 160 MG tablet Take 160 mg by mouth daily.     acetaminophen (TYLENOL) 325 MG tablet Take 650 mg by mouth every 6 (six) hours as needed for moderate pain. (Patient not taking: Reported on 09/28/2022)     ibuprofen (ADVIL,MOTRIN) 200 MG tablet Take 600 mg by mouth every 6 (six) hours as needed for moderate pain. (Patient not taking: Reported on 09/28/2022)     lisinopril (PRINIVIL,ZESTRIL) 2.5 MG tablet Take 2.5 mg by mouth daily. (Patient not taking: Reported on 09/28/2022)     ondansetron (ZOFRAN ODT) 8 MG disintegrating tablet 8mg  ODT q8 hours prn nausea (Patient not taking: Reported on 09/28/2022) 10 tablet 0   oxyCODONE-acetaminophen (PERCOCET) 5-325 MG tablet Take 1 tablet by mouth every 6 (six) hours as  needed. (Patient not taking: Reported on 09/28/2022) 13 tablet 0   polyethylene glycol (MIRALAX / GLYCOLAX) packet Take 17 g by mouth daily as needed for moderate constipation. (Patient not taking: Reported on 09/28/2022)     tamsulosin (FLOMAX) 0.4 MG CAPS capsule Take 1 capsule (0.4 mg total) by mouth daily. (Patient not taking: Reported on 09/28/2022) 30 capsule 0   No current facility-administered medications for this visit.    Allergies:   Patient has no known allergies.    Social History:  The patient  reports that he has never smoked. He has never used smokeless tobacco. He reports that he does not drink alcohol and does not use drugs.   Family History:  The patient's family history includes Alzheimer's disease in his father; CAD in his brother.    ROS:  Please see the history of present illness.   Otherwise, review of systems are positive for none.   All other systems are reviewed and negative.    PHYSICAL EXAM: VS:  BP 114/80 (BP Location: Right Arm, Patient Position: Sitting, Cuff Size: Normal)   Pulse 62   Ht 6\' 1"  (1.854 m)   Wt 203 lb 9.6 oz (92.4 kg)   SpO2 98%   BMI 26.86 kg/m  , BMI Body mass index is 26.86 kg/m. GENERAL:  Well appearing HEENT:  Pupils equal round and reactive, fundi not visualized, oral mucosa unremarkable NECK:  No jugular venous distention, waveform within normal limits, carotid upstroke brisk and symmetric, no bruits, no thyromegaly LYMPHATICS:  No cervical, inguinal adenopathy LUNGS:  Clear to auscultation bilaterally BACK:  No CVA tenderness CHEST:  Unremarkable HEART:  PMI not displaced or sustained,S1 and S2 within normal limits, no S3, no S4, no clicks, no rubs, no murmurs ABD:  Flat, positive bowel sounds normal in frequency in pitch, no bruits, no rebound, no guarding, no midline pulsatile mass, no hepatomegaly, no splenomegaly EXT:  2 plus pulses throughout, no edema, no cyanosis no clubbing SKIN:  No rashes no nodules NEURO:  Cranial  nerves II through XII grossly intact, motor grossly intact throughout [facility]:  Cognitively intact, oriented to person place and time    EKG:  EKG Interpretation Date/Time:  Thursday September 28 2022 13:22:09 EDT Ventricular Rate:  62 PR Interval:  208 QRS Duration:  84 QT Interval:  426 QTC Calculation: 432 R Axis:   27  Text Interpretation: Normal sinus rhythm Normal ECG When compared with ECG of 28-May-2017 15:09, No significant change was found Confirmed by Frank Walsh ([id number]) on 09/28/2022 1:47:28 PM     Recent Labs: No results found for requested labs within last  365 days.    Lipid Panel No results found for: "CHOL", "TRIG", "HDL", "CHOLHDL", "VLDL", "LDLCALC", "LDLDIRECT"    Wt Readings from Last 3 Encounters:  09/28/22 203 lb 9.6 oz (92.4 kg)  02/10/20 221 lb 8 oz (100.5 kg)  05/28/17 227 lb (103 kg)      Other studies Reviewed: Additional studies/ records that were reviewed today include: Labs. Review of the above records demonstrates:  Please see elsewhere in the note.     ASSESSMENT AND PLAN:   CAD: The patient had some vague chest discomfort.  He has had progression of his coronary calcium.  He has had known nonobstructive coronary disease on cath several years ago.  The pretest probability of obstructive coronary disease causing his symptoms is moderately high and so coronary CTA is suggested.    DM: He said recently has hemoglobin A1c was up to 9 I see now that it is down to 5.7.  Continue management per primary care.  Dyslipidemia: LDL was actually 68.  HDL is 32.  Goal should be an LDL in the 50s.  We talked about diet and he could have this repeated when he changes his diet and might need to have up titration of his meds.  I will check an LP(a).  HTN: Blood pressure is controlled.  No change in therapy.  Current medicines are reviewed at length with the patient today.  The patient does not have concerns regarding medicines.  The following changes  have been made:  no change  Labs/ tests ordered today include:   Orders Placed This Encounter  Procedures   CT CORONARY MORPH W/CTA COR W/SCORE W/CA W/CM &/OR WO/CM   Basic metabolic panel   Lipoprotein A (LPA)   EKG 12-Lead     Disposition:   FU with with me in 12 months.     Signed, Frank Rotunda, MD  09/28/2022 2:14 PM    [facility]

## 2022-09-28 ENCOUNTER — Ambulatory Visit: Payer: Medicare Other | Attending: Cardiology | Admitting: Cardiology

## 2022-09-28 ENCOUNTER — Encounter: Payer: Self-pay | Admitting: Cardiology

## 2022-09-28 VITALS — BP 114/80 | HR 62 | Ht 73.0 in | Wt 203.6 lb

## 2022-09-28 DIAGNOSIS — I2510 Atherosclerotic heart disease of native coronary artery without angina pectoris: Secondary | ICD-10-CM | POA: Insufficient documentation

## 2022-09-28 DIAGNOSIS — R079 Chest pain, unspecified: Secondary | ICD-10-CM | POA: Diagnosis not present

## 2022-09-28 MED ORDER — METOPROLOL TARTRATE 50 MG PO TABS: 50.0000 mg | ORAL_TABLET | Freq: Once | ORAL | 0 refills | Status: AC

## 2022-09-28 NOTE — Patient Instructions (Signed)
Medication Instructions:  No Changes *If you need a refill on your cardiac medications before your next appointment, please call your pharmacy*   Lab Work: Bmet and LPA drawn today If you have labs (blood work) drawn today and your tests are completely normal, you will receive your results only by: MyChart Message (if you have MyChart) OR A paper copy in the mail If you have any lab test that is abnormal or we need to change your treatment, we will call you to review the results.   Testing/Procedures:   Your cardiac CT will be scheduled at one of the below locations:   [facility] [address] [place], [place] [address] [phone]  OR  [facility] [address] [place], [place] [address] [phone]  OR   [facility] [address] [place], [place] [address] [phone]  If scheduled at [facility], please arrive at the [facility] (Entrance C2) of [facility] 30 minutes prior to test start time. You can use the FREE valet parking offered at entrance C (encouraged to control the heart rate for the test)  Proceed to the [facility] Radiology Department (first floor) to check-in and test prep.  All radiology patients and guests should use entrance C2 at [facility], accessed from [facility], even though the hospital's physical address listed is [address]    If scheduled at [facility] or [facility], please arrive 15 mins early for check-in and test prep.  There is spacious parking and easy access to the radiology department from the [facility] entrance. Please enter here and check-in with the desk attendant.   Please follow these instructions carefully (unless otherwise directed):  An IV will be required for this test and Nitroglycerin will be given.   Hold all erectile dysfunction medications at least 3 days (72 hrs) prior to test. (Ie viagra, cialis, sildenafil, tadalafil, etc)   On the Night Before the Test: Be sure to Drink plenty of water. Do not consume any caffeinated/decaffeinated beverages or chocolate 12 hours prior to your test. Do not take any antihistamines 12 hours prior to your test.  On the Day of the Test: Drink plenty of water until 1 hour prior to the test. Do not eat any food 1 hour prior to test. You may take your regular medications prior to the test.  Take metoprolol (Lopressor) two hours prior to test. If you take Furosemide/Hydrochlorothiazide/Spironolactone, please HOLD on the morning of the test.       After the Test: Drink plenty of water. After receiving IV contrast, you may experience a mild flushed feeling. This is normal. On occasion, you may experience a mild rash up to 24 hours after the test. This is not dangerous. If this occurs, you can take Benadryl 25 mg and increase your fluid intake. If you experience trouble breathing, this can be serious. If it is severe call 911 IMMEDIATELY. If it is mild, please call our office. If you take any of these medications: Glipizide/Metformin, Avandament, Glucavance, please do not take 48 hours after completing test unless otherwise instructed.  We will call to schedule your test 2-4 weeks out understanding that some insurance companies will need an authorization prior to the service being performed.   For more information and frequently asked questions, please visit our website : [email or web address]  For non-scheduling related questions, please  contact the cardiac imaging nurse navigator should you have any questions/concerns: Cardiac Imaging Nurse Navigators Direct Office Dial: [phone]   For scheduling needs, including cancellations and rescheduling, please call [place], [phone].    Follow-Up: At [facility], you and your  health needs are our priority.  As part of our continuing mission to provide you with exceptional heart care, we have created designated Provider Care Teams.  These Care Teams include your primary Cardiologist (physician) and Advanced Practice Providers (APPs -  Physician Assistants and Nurse Practitioners) who all work together to provide you with the care you need, when you need it.  We recommend signing up for the patient portal called "MyChart".  Sign up information is provided on this After Visit Summary.  MyChart is used to connect with patients for Virtual Visits (Telemedicine).  Patients are able to view lab/test results, encounter notes, upcoming appointments, etc.  Non-urgent messages can be sent to your provider as well.   To learn more about what you can do with MyChart, go to [email or web address].    Your next appointment:   1 year(s)  Provider:   Dr. Antoine Poche

## 2022-09-29 ENCOUNTER — Other Ambulatory Visit: Payer: Self-pay | Admitting: Cardiology

## 2022-09-29 DIAGNOSIS — Z23 Encounter for immunization: Secondary | ICD-10-CM | POA: Diagnosis not present

## 2022-09-29 LAB — BASIC METABOLIC PANEL
BUN/Creatinine Ratio: 13 (ref 10–24)
BUN: 14 mg/dL (ref 8–27)
CO2: 22 mmol/L (ref 20–29)
Calcium: 9.5 mg/dL (ref 8.6–10.2)
Chloride: 105 mmol/L (ref 96–106)
Creatinine, Ser: 1.04 mg/dL (ref 0.76–1.27)
Glucose: 91 mg/dL (ref 70–99)
Potassium: 4.4 mmol/L (ref 3.5–5.2)
Sodium: 143 mmol/L (ref 134–144)
eGFR: 76 mL/min/{1.73_m2} (ref >59)

## 2022-09-29 LAB — LIPOPROTEIN A (LPA): Lipoprotein (a): 189.7 nmol/L — ABNORMAL HIGH (ref <75.0)

## 2022-10-03 ENCOUNTER — Telehealth: Payer: Self-pay | Admitting: *Deleted

## 2022-10-03 DIAGNOSIS — S62356A Nondisplaced fracture of shaft of fifth metacarpal bone, right hand, initial encounter for closed fracture: Secondary | ICD-10-CM | POA: Diagnosis not present

## 2022-10-03 NOTE — Telephone Encounter (Signed)
Pre-operative Risk Assessment    Patient Name: Frank Walsh  DOB: 1950-10-05 MRN: [id number]    LAST VISIT:  09/28/22 HAS CARDIAC CT 10/1  THIS IS A STAT REQUEST FOR ANEMIA / POSITIVE POB  Request for Surgical Clearance    Procedure:   ENDOSCOPY / COLONOSCOPY  Date of Surgery:  Clearance 10/09/22                                 Surgeon:  DR. Kerin Salen Surgeon's Group or Practice Name:  [facility] Phone number:  [phone] Fax number:  [phone]   Type of Clearance Requested:   - Medical    Type of Anesthesia:   PROPOFOL   Additional requests/questions:    Wilhemina Cash   10/03/2022, 9:04 AM

## 2022-10-04 NOTE — Telephone Encounter (Signed)
     Primary Cardiologist: Rollene Rotunda, MD  Chart reviewed as part of pre-operative protocol coverage. Given past medical history and time since last visit, based on ACC/AHA guidelines, Frank Walsh would be at acceptable risk for the planned procedure without further cardiovascular testing.    I will route this recommendation to the requesting party via Epic fax function and remove from pre-op pool.  Please call with questions.  Thomasene Ripple. Eiliana Drone NP-C     10/04/2022, 4:00 PM [facility] [address] Office [phone] Fax [phone]

## 2022-10-06 ENCOUNTER — Encounter: Payer: Self-pay | Admitting: *Deleted

## 2022-10-09 ENCOUNTER — Telehealth (HOSPITAL_COMMUNITY): Payer: Self-pay | Admitting: Emergency Medicine

## 2022-10-09 ENCOUNTER — Encounter (INDEPENDENT_AMBULATORY_CARE_PROVIDER_SITE_OTHER): Payer: Medicare Other | Admitting: Ophthalmology

## 2022-10-09 DIAGNOSIS — K3189 Other diseases of stomach and duodenum: Secondary | ICD-10-CM | POA: Diagnosis not present

## 2022-10-09 DIAGNOSIS — H35371 Puckering of macula, right eye: Secondary | ICD-10-CM

## 2022-10-09 DIAGNOSIS — H43813 Vitreous degeneration, bilateral: Secondary | ICD-10-CM | POA: Diagnosis not present

## 2022-10-09 DIAGNOSIS — D509 Iron deficiency anemia, unspecified: Secondary | ICD-10-CM | POA: Diagnosis not present

## 2022-10-09 DIAGNOSIS — H33303 Unspecified retinal break, bilateral: Secondary | ICD-10-CM | POA: Diagnosis not present

## 2022-10-09 DIAGNOSIS — D123 Benign neoplasm of transverse colon: Secondary | ICD-10-CM | POA: Diagnosis not present

## 2022-10-09 DIAGNOSIS — K6289 Other specified diseases of anus and rectum: Secondary | ICD-10-CM | POA: Diagnosis not present

## 2022-10-09 DIAGNOSIS — K644 Residual hemorrhoidal skin tags: Secondary | ICD-10-CM | POA: Diagnosis not present

## 2022-10-09 DIAGNOSIS — K2930 Chronic superficial gastritis without bleeding: Secondary | ICD-10-CM | POA: Diagnosis not present

## 2022-10-09 DIAGNOSIS — R195 Other fecal abnormalities: Secondary | ICD-10-CM | POA: Diagnosis not present

## 2022-10-09 DIAGNOSIS — D649 Anemia, unspecified: Secondary | ICD-10-CM | POA: Diagnosis not present

## 2022-10-09 DIAGNOSIS — K648 Other hemorrhoids: Secondary | ICD-10-CM | POA: Diagnosis not present

## 2022-10-09 DIAGNOSIS — K5730 Diverticulosis of large intestine without perforation or abscess without bleeding: Secondary | ICD-10-CM | POA: Diagnosis not present

## 2022-10-09 DIAGNOSIS — D120 Benign neoplasm of cecum: Secondary | ICD-10-CM | POA: Diagnosis not present

## 2022-10-09 NOTE — Telephone Encounter (Signed)
Reaching out to patient to offer assistance regarding upcoming cardiac imaging study; pt verbalizes understanding of appt date/time, parking situation and where to check in, pre-test NPO status and medications ordered, and verified current allergies; name and call back number provided for further questions should they arise Rockwell Alexandria RN Navigator Cardiac Imaging [facility] [phone] office [phone] cell  Holding BP meds, taking metop 2 hr prior

## 2022-10-09 NOTE — Telephone Encounter (Signed)
Attempted to call patient regarding upcoming cardiac CT appointment. °Left message on voicemail with name and callback number °Dwan Fennel RN Navigator Cardiac Imaging °[facility] °[phone] Office °[phone] Cell ° °

## 2022-10-10 ENCOUNTER — Ambulatory Visit (HOSPITAL_COMMUNITY)
Admission: RE | Admit: 2022-10-10 | Discharge: 2022-10-10 | Disposition: A | Discharge status: Home / Self Care | Payer: Medicare Other | Source: Ambulatory Visit | Attending: Cardiology | Admitting: Cardiology

## 2022-10-10 ENCOUNTER — Other Ambulatory Visit: Payer: Self-pay | Admitting: Cardiology

## 2022-10-10 DIAGNOSIS — R931 Abnormal findings on diagnostic imaging of heart and coronary circulation: Secondary | ICD-10-CM | POA: Diagnosis not present

## 2022-10-10 DIAGNOSIS — I2510 Atherosclerotic heart disease of native coronary artery without angina pectoris: Secondary | ICD-10-CM

## 2022-10-10 DIAGNOSIS — R079 Chest pain, unspecified: Secondary | ICD-10-CM | POA: Diagnosis not present

## 2022-10-10 DIAGNOSIS — D649 Anemia, unspecified: Secondary | ICD-10-CM | POA: Diagnosis not present

## 2022-10-10 DIAGNOSIS — E119 Type 2 diabetes mellitus without complications: Secondary | ICD-10-CM | POA: Diagnosis not present

## 2022-10-10 MED ORDER — NITROGLYCERIN 0.4 MG SL SUBL
0.8000 mg | SUBLINGUAL_TABLET | Freq: Once | SUBLINGUAL | Status: AC
  Administered 2022-10-10: 0.8 mg via SUBLINGUAL

## 2022-10-10 MED ORDER — IOHEXOL 350 MG/ML SOLN
100.0000 mL | Freq: Once | INTRAVENOUS | Status: AC | PRN
  Administered 2022-10-10: 100 mL via INTRAVENOUS

## 2022-10-10 MED ORDER — NITROGLYCERIN 0.4 MG SL SUBL
SUBLINGUAL_TABLET | SUBLINGUAL | Status: AC
  Filled 2022-10-10: qty 2

## 2022-10-17 DIAGNOSIS — D123 Benign neoplasm of transverse colon: Secondary | ICD-10-CM | POA: Diagnosis not present

## 2022-10-17 DIAGNOSIS — D120 Benign neoplasm of cecum: Secondary | ICD-10-CM | POA: Diagnosis not present

## 2022-10-17 DIAGNOSIS — K2930 Chronic superficial gastritis without bleeding: Secondary | ICD-10-CM | POA: Diagnosis not present

## 2022-10-31 DIAGNOSIS — S62356A Nondisplaced fracture of shaft of fifth metacarpal bone, right hand, initial encounter for closed fracture: Secondary | ICD-10-CM | POA: Diagnosis not present

## 2023-02-21 DIAGNOSIS — Z1331 Encounter for screening for depression: Secondary | ICD-10-CM | POA: Diagnosis not present

## 2023-02-21 DIAGNOSIS — Z0000 Encounter for general adult medical examination without abnormal findings: Secondary | ICD-10-CM | POA: Diagnosis not present

## 2023-02-21 DIAGNOSIS — I10 Essential (primary) hypertension: Secondary | ICD-10-CM | POA: Diagnosis not present

## 2023-02-21 DIAGNOSIS — I2510 Atherosclerotic heart disease of native coronary artery without angina pectoris: Secondary | ICD-10-CM | POA: Diagnosis not present

## 2023-02-21 DIAGNOSIS — Z125 Encounter for screening for malignant neoplasm of prostate: Secondary | ICD-10-CM | POA: Diagnosis not present

## 2023-02-21 DIAGNOSIS — Z79899 Other long term (current) drug therapy: Secondary | ICD-10-CM | POA: Diagnosis not present

## 2023-02-21 DIAGNOSIS — M545 Low back pain, unspecified: Secondary | ICD-10-CM | POA: Diagnosis not present

## 2023-02-21 DIAGNOSIS — E559 Vitamin D deficiency, unspecified: Secondary | ICD-10-CM | POA: Diagnosis not present

## 2023-02-21 DIAGNOSIS — E119 Type 2 diabetes mellitus without complications: Secondary | ICD-10-CM | POA: Diagnosis not present

## 2023-02-21 DIAGNOSIS — G8929 Other chronic pain: Secondary | ICD-10-CM | POA: Diagnosis not present

## 2023-02-21 DIAGNOSIS — R931 Abnormal findings on diagnostic imaging of heart and coronary circulation: Secondary | ICD-10-CM | POA: Diagnosis not present

## 2023-02-21 DIAGNOSIS — Z23 Encounter for immunization: Secondary | ICD-10-CM | POA: Diagnosis not present

## 2023-02-21 DIAGNOSIS — E782 Mixed hyperlipidemia: Secondary | ICD-10-CM | POA: Diagnosis not present

## 2023-04-04 DIAGNOSIS — L570 Actinic keratosis: Secondary | ICD-10-CM | POA: Diagnosis not present

## 2023-04-04 DIAGNOSIS — D2371 Other benign neoplasm of skin of right lower limb, including hip: Secondary | ICD-10-CM | POA: Diagnosis not present

## 2023-04-04 DIAGNOSIS — L821 Other seborrheic keratosis: Secondary | ICD-10-CM | POA: Diagnosis not present

## 2023-04-04 DIAGNOSIS — Z85828 Personal history of other malignant neoplasm of skin: Secondary | ICD-10-CM | POA: Diagnosis not present

## 2023-04-04 DIAGNOSIS — L814 Other melanin hyperpigmentation: Secondary | ICD-10-CM | POA: Diagnosis not present

## 2023-05-23 DIAGNOSIS — H6122 Impacted cerumen, left ear: Secondary | ICD-10-CM | POA: Diagnosis not present

## 2023-05-23 DIAGNOSIS — Z23 Encounter for immunization: Secondary | ICD-10-CM | POA: Diagnosis not present

## 2023-08-29 DIAGNOSIS — I2510 Atherosclerotic heart disease of native coronary artery without angina pectoris: Secondary | ICD-10-CM | POA: Diagnosis not present

## 2023-08-29 DIAGNOSIS — Z79899 Other long term (current) drug therapy: Secondary | ICD-10-CM | POA: Diagnosis not present

## 2023-08-29 DIAGNOSIS — I10 Essential (primary) hypertension: Secondary | ICD-10-CM | POA: Diagnosis not present

## 2023-08-29 DIAGNOSIS — E559 Vitamin D deficiency, unspecified: Secondary | ICD-10-CM | POA: Diagnosis not present

## 2023-08-29 DIAGNOSIS — R931 Abnormal findings on diagnostic imaging of heart and coronary circulation: Secondary | ICD-10-CM | POA: Diagnosis not present

## 2023-08-29 DIAGNOSIS — E782 Mixed hyperlipidemia: Secondary | ICD-10-CM | POA: Diagnosis not present

## 2023-08-29 DIAGNOSIS — E119 Type 2 diabetes mellitus without complications: Secondary | ICD-10-CM | POA: Diagnosis not present

## 2023-09-03 DIAGNOSIS — Z85828 Personal history of other malignant neoplasm of skin: Secondary | ICD-10-CM | POA: Diagnosis not present

## 2023-09-03 DIAGNOSIS — L570 Actinic keratosis: Secondary | ICD-10-CM | POA: Diagnosis not present

## 2023-09-03 DIAGNOSIS — D044 Carcinoma in situ of skin of scalp and neck: Secondary | ICD-10-CM | POA: Diagnosis not present

## 2023-09-03 DIAGNOSIS — D485 Neoplasm of uncertain behavior of skin: Secondary | ICD-10-CM | POA: Diagnosis not present

## 2023-10-10 ENCOUNTER — Encounter (INDEPENDENT_AMBULATORY_CARE_PROVIDER_SITE_OTHER): Payer: BLUE CROSS/BLUE SHIELD | Admitting: Ophthalmology

## 2023-10-10 DIAGNOSIS — H43813 Vitreous degeneration, bilateral: Secondary | ICD-10-CM | POA: Diagnosis not present

## 2023-10-10 DIAGNOSIS — H33303 Unspecified retinal break, bilateral: Secondary | ICD-10-CM | POA: Diagnosis not present

## 2023-10-10 DIAGNOSIS — H35371 Puckering of macula, right eye: Secondary | ICD-10-CM | POA: Diagnosis not present

## 2023-10-22 DIAGNOSIS — Z23 Encounter for immunization: Secondary | ICD-10-CM | POA: Diagnosis not present

## 2023-11-19 DIAGNOSIS — I2510 Atherosclerotic heart disease of native coronary artery without angina pectoris: Secondary | ICD-10-CM | POA: Insufficient documentation

## 2023-11-19 NOTE — Progress Notes (Unsigned)
  Cardiology Office Note:   Date:  11/21/2023  ID:  Frank Walsh, DOB 03/22/1950, MRN [id number] PCP: Charlott Dorn LABOR, MD  [facility] Providers Cardiologist:  Lynwood Schilling, MD {  History of Present Illness:   Frank Walsh is a 73 y.o. male who presents for evaluation of evaluation of chest pain.  He said he had chest discomfort about 4 to 5 months ago.   saw him in 2016 for evaluation of significant cardiovascular risk factors.  He had an equivocal stress test.  Coronary ostium was 648 which was 85th percentile.  Cardiac cath at that time demonstrated 40% proximal LAD stenosis and diffuse disease elsewhere with no high-grade stenosis.  He was managed medically.    Coronary calcium this summer was 2392 which was 93rd percentile.    He presents for follow up.  He had a coronary CT that demonstrated 50 to 69% stenosis in the proximal and mid LAD and mid RCA and left stenosis in the proximal RCA.  FFR demonstrated no flow-limiting lesions.  For follow-up and has done quite well. The patient denies any new symptoms such as chest discomfort, neck or arm discomfort. There has been no new shortness of breath, PND or orthopnea. There have been no reported palpitations, presyncope or syncope.  He is an active fisherman.  He goes to [employer].  He walks [employer] routinely.   ROS: As stated in the HPI and negative for all other systems.   Studies Reviewed:    EKG:   EKG Interpretation Date/Time:  Wednesday November 21 2023 11:59:27 EST Ventricular Rate:  64 PR Interval:  200 QRS Duration:  90 QT Interval:  396 QTC Calculation: 408 R Axis:   32  Text Interpretation: Normal sinus rhythm Early transition When compared with ECG of 28-Sep-2022 13:22, No significant change was found Confirmed by Schilling Lynwood ([id number]) on 11/21/2023 12:17:53 PM    Risk Assessment/Calculations:     Physical Exam:   VS:  BP (!) 148/94   Pulse 64   Ht 6' 1 (1.854 m)   Wt 219 lb 6.4  oz (99.5 kg)   SpO2 97%   BMI 28.95 kg/m    Wt Readings from Last 3 Encounters:  11/21/23 219 lb 6.4 oz (99.5 kg)  09/28/22 203 lb 9.6 oz (92.4 kg)  02/10/20 221 lb 8 oz (100.5 kg)     GEN: Well nourished, well developed in no acute distress NECK: No JVD; No carotid bruits CARDIAC: RRR, no murmurs, rubs, gallops RESPIRATORY:  Clear to auscultation without rales, wheezing or rhonchi  ABDOMEN: Soft, non-tender, non-distended EXTREMITIES:  No edema; No deformity   ASSESSMENT AND PLAN:       CAD:  The patient has no new sypmtoms.  No further cardiovascular testing is indicated.  We will continue with aggressive risk reduction and meds as listed.  DM:    His A1c was 6.6.  He will continue management with Charlott Dorn LABOR, MD  Dyslipidemia: LDL was 62.  No change in therapy.    HTN: Blood pressure is not at at target.  I am going to add amlodipine 2.5 mg daily and he can keep his blood pressure diary.   Follow up with me in 1 year or sooner if needed.  Signed, Lynwood Schilling, MD

## 2023-11-21 ENCOUNTER — Ambulatory Visit: Attending: Cardiology | Admitting: Cardiology

## 2023-11-21 ENCOUNTER — Other Ambulatory Visit (HOSPITAL_COMMUNITY): Payer: Self-pay

## 2023-11-21 ENCOUNTER — Encounter: Payer: Self-pay | Admitting: Cardiology

## 2023-11-21 VITALS — BP 148/94 | HR 64 | Ht 73.0 in | Wt 219.4 lb

## 2023-11-21 DIAGNOSIS — I10 Essential (primary) hypertension: Secondary | ICD-10-CM | POA: Insufficient documentation

## 2023-11-21 DIAGNOSIS — E118 Type 2 diabetes mellitus with unspecified complications: Secondary | ICD-10-CM | POA: Diagnosis not present

## 2023-11-21 DIAGNOSIS — I2510 Atherosclerotic heart disease of native coronary artery without angina pectoris: Secondary | ICD-10-CM | POA: Insufficient documentation

## 2023-11-21 DIAGNOSIS — E785 Hyperlipidemia, unspecified: Secondary | ICD-10-CM | POA: Insufficient documentation

## 2023-11-21 MED ORDER — AMLODIPINE BESYLATE 2.5 MG PO TABS
2.5000 mg | ORAL_TABLET | Freq: Every day | ORAL | 3 refills | Status: AC
  Filled 2023-11-21: qty 90, 90d supply, fill #0
  Filled 2024-02-15: qty 90, 90d supply, fill #1

## 2023-11-21 NOTE — Patient Instructions (Signed)
 Medication Instructions:  Your physician has recommended you make the following change in your medication:   ** Start Amlodipine 2.5mg  - 1 tablet by mouth daily   *If you need a refill on your cardiac medications before your next appointment, please call your pharmacy*  Lab Work: None ordered.] If you have labs (blood work) drawn today and your tests are completely normal, you will receive your results only by: MyChart Message (if you have MyChart) OR A paper copy in the mail If you have any lab test that is abnormal or we need to change your treatment, we will call you to review the results.  Testing/Procedures: None ordered.   Follow-Up: At [facility], you and your health needs are our priority.  As part of our continuing mission to provide you with exceptional heart care, our providers are all part of one team.  This team includes your primary Cardiologist (physician) and Advanced Practice Providers or APPs (Physician Assistants and Nurse Practitioners) who all work together to provide you with the care you need, when you need it.  Your next appointment:   12 months with Dr Lavona

## 2024-01-20 ENCOUNTER — Other Ambulatory Visit (HOSPITAL_COMMUNITY): Payer: Self-pay

## 2024-02-15 ENCOUNTER — Other Ambulatory Visit (HOSPITAL_COMMUNITY): Payer: Self-pay

## 2024-10-15 ENCOUNTER — Encounter (INDEPENDENT_AMBULATORY_CARE_PROVIDER_SITE_OTHER): Admitting: Ophthalmology
